# Patient Record
Sex: Female | Born: 1940 | Race: White | Hispanic: No | Marital: Married | Smoking: Former smoker
Health system: Southern US, Community
[De-identification: ages and names within clinical notes are randomized; demographics above are authoritative.]

## PROBLEM LIST (undated history)

## (undated) DIAGNOSIS — F32A Depression, unspecified: Secondary | ICD-10-CM

## (undated) DIAGNOSIS — M1990 Unspecified osteoarthritis, unspecified site: Secondary | ICD-10-CM

## (undated) DIAGNOSIS — K449 Diaphragmatic hernia without obstruction or gangrene: Secondary | ICD-10-CM

## (undated) DIAGNOSIS — M810 Age-related osteoporosis without current pathological fracture: Secondary | ICD-10-CM

## (undated) DIAGNOSIS — K219 Gastro-esophageal reflux disease without esophagitis: Secondary | ICD-10-CM

## (undated) DIAGNOSIS — D126 Benign neoplasm of colon, unspecified: Secondary | ICD-10-CM

## (undated) DIAGNOSIS — F419 Anxiety disorder, unspecified: Secondary | ICD-10-CM

## (undated) DIAGNOSIS — U071 COVID-19: Secondary | ICD-10-CM

## (undated) DIAGNOSIS — F329 Major depressive disorder, single episode, unspecified: Secondary | ICD-10-CM

## (undated) DIAGNOSIS — F320 Major depressive disorder, single episode, mild: Secondary | ICD-10-CM

## (undated) DIAGNOSIS — E7800 Pure hypercholesterolemia, unspecified: Secondary | ICD-10-CM

## (undated) DIAGNOSIS — M8580 Other specified disorders of bone density and structure, unspecified site: Secondary | ICD-10-CM

## (undated) DIAGNOSIS — I10 Essential (primary) hypertension: Secondary | ICD-10-CM

## (undated) DIAGNOSIS — N6019 Diffuse cystic mastopathy of unspecified breast: Secondary | ICD-10-CM

## (undated) HISTORY — DX: Anxiety disorder, unspecified: F41.9

## (undated) HISTORY — PX: CARPAL TUNNEL RELEASE: SHX101

## (undated) HISTORY — DX: Unspecified osteoarthritis, unspecified site: M19.90

## (undated) HISTORY — PX: OTHER SURGICAL HISTORY: SHX169

## (undated) HISTORY — DX: Benign neoplasm of colon, unspecified: D12.6

## (undated) HISTORY — DX: Pure hypercholesterolemia, unspecified: E78.00

## (undated) HISTORY — DX: Gastro-esophageal reflux disease without esophagitis: K21.9

## (undated) HISTORY — DX: Essential (primary) hypertension: I10

## (undated) HISTORY — DX: Major depressive disorder, single episode, mild: F32.0

## (undated) HISTORY — PX: TONSILLECTOMY: SUR1361

## (undated) HISTORY — PX: ABDOMINAL HYSTERECTOMY: SHX81

## (undated) HISTORY — DX: Depression, unspecified: F32.A

## (undated) HISTORY — DX: Major depressive disorder, single episode, unspecified: F32.9

## (undated) HISTORY — DX: Other specified disorders of bone density and structure, unspecified site: M85.80

## (undated) HISTORY — DX: Diffuse cystic mastopathy of unspecified breast: N60.19

## (undated) HISTORY — DX: Diaphragmatic hernia without obstruction or gangrene: K44.9

## (undated) HISTORY — DX: Age-related osteoporosis without current pathological fracture: M81.0

---

## 1971-08-12 DIAGNOSIS — N814 Uterovaginal prolapse, unspecified: Secondary | ICD-10-CM

## 1971-08-12 HISTORY — DX: Uterovaginal prolapse, unspecified: N81.4

## 1996-03-15 ENCOUNTER — Encounter: Payer: Self-pay | Admitting: Gastroenterology

## 1999-09-25 ENCOUNTER — Encounter: Admission: RE | Admit: 1999-09-25 | Discharge: 1999-09-25 | Payer: Self-pay | Admitting: Internal Medicine

## 1999-09-25 ENCOUNTER — Encounter: Payer: Self-pay | Admitting: Internal Medicine

## 1999-11-14 ENCOUNTER — Other Ambulatory Visit: Admission: RE | Admit: 1999-11-14 | Discharge: 1999-11-14 | Payer: Self-pay | Admitting: Internal Medicine

## 2000-09-29 ENCOUNTER — Encounter: Payer: Self-pay | Admitting: Internal Medicine

## 2000-09-29 ENCOUNTER — Encounter: Admission: RE | Admit: 2000-09-29 | Discharge: 2000-09-29 | Payer: Self-pay | Admitting: Internal Medicine

## 2000-11-20 ENCOUNTER — Other Ambulatory Visit: Admission: RE | Admit: 2000-11-20 | Discharge: 2000-11-20 | Payer: Self-pay | Admitting: Internal Medicine

## 2000-11-24 ENCOUNTER — Encounter: Admission: RE | Admit: 2000-11-24 | Discharge: 2000-11-24 | Payer: Self-pay | Admitting: Internal Medicine

## 2000-11-24 ENCOUNTER — Encounter: Payer: Self-pay | Admitting: Internal Medicine

## 2001-03-15 ENCOUNTER — Encounter: Admission: RE | Admit: 2001-03-15 | Discharge: 2001-03-15 | Payer: Self-pay | Admitting: Neurosurgery

## 2001-03-15 ENCOUNTER — Encounter: Payer: Self-pay | Admitting: Internal Medicine

## 2001-10-14 ENCOUNTER — Encounter: Payer: Self-pay | Admitting: Internal Medicine

## 2001-10-14 ENCOUNTER — Encounter: Admission: RE | Admit: 2001-10-14 | Discharge: 2001-10-14 | Payer: Self-pay | Admitting: Internal Medicine

## 2001-12-02 ENCOUNTER — Other Ambulatory Visit: Admission: RE | Admit: 2001-12-02 | Discharge: 2001-12-02 | Payer: Self-pay | Admitting: Internal Medicine

## 2002-10-20 ENCOUNTER — Encounter: Admission: RE | Admit: 2002-10-20 | Discharge: 2002-10-20 | Payer: Self-pay | Admitting: Internal Medicine

## 2002-10-20 ENCOUNTER — Encounter: Payer: Self-pay | Admitting: Internal Medicine

## 2002-12-22 ENCOUNTER — Other Ambulatory Visit: Admission: RE | Admit: 2002-12-22 | Discharge: 2002-12-22 | Payer: Self-pay | Admitting: Internal Medicine

## 2003-08-31 ENCOUNTER — Encounter: Payer: Self-pay | Admitting: Gastroenterology

## 2003-11-29 ENCOUNTER — Encounter: Admission: RE | Admit: 2003-11-29 | Discharge: 2003-11-29 | Payer: Self-pay | Admitting: Internal Medicine

## 2004-01-04 ENCOUNTER — Emergency Department (HOSPITAL_COMMUNITY): Admission: EM | Admit: 2004-01-04 | Discharge: 2004-01-05 | Payer: Self-pay | Admitting: Emergency Medicine

## 2004-01-10 ENCOUNTER — Ambulatory Visit (HOSPITAL_COMMUNITY): Admission: RE | Admit: 2004-01-10 | Discharge: 2004-01-10 | Payer: Self-pay | Admitting: *Deleted

## 2004-01-10 ENCOUNTER — Ambulatory Visit (HOSPITAL_BASED_OUTPATIENT_CLINIC_OR_DEPARTMENT_OTHER): Admission: RE | Admit: 2004-01-10 | Discharge: 2004-01-10 | Payer: Self-pay | Admitting: *Deleted

## 2004-02-16 ENCOUNTER — Ambulatory Visit (HOSPITAL_BASED_OUTPATIENT_CLINIC_OR_DEPARTMENT_OTHER): Admission: RE | Admit: 2004-02-16 | Discharge: 2004-02-16 | Payer: Self-pay | Admitting: *Deleted

## 2004-12-13 ENCOUNTER — Ambulatory Visit: Payer: Self-pay | Admitting: Gastroenterology

## 2004-12-18 ENCOUNTER — Ambulatory Visit: Payer: Self-pay | Admitting: Gastroenterology

## 2005-01-10 ENCOUNTER — Encounter: Admission: RE | Admit: 2005-01-10 | Discharge: 2005-01-10 | Payer: Self-pay | Admitting: Internal Medicine

## 2005-01-27 ENCOUNTER — Encounter: Admission: RE | Admit: 2005-01-27 | Discharge: 2005-01-27 | Payer: Self-pay | Admitting: Internal Medicine

## 2006-03-25 ENCOUNTER — Encounter: Admission: RE | Admit: 2006-03-25 | Discharge: 2006-03-25 | Payer: Self-pay | Admitting: Internal Medicine

## 2006-04-23 ENCOUNTER — Ambulatory Visit: Payer: Self-pay | Admitting: Gastroenterology

## 2007-06-30 ENCOUNTER — Encounter: Admission: RE | Admit: 2007-06-30 | Discharge: 2007-06-30 | Payer: Self-pay | Admitting: Internal Medicine

## 2007-08-27 ENCOUNTER — Ambulatory Visit: Payer: Self-pay | Admitting: Gastroenterology

## 2008-08-25 ENCOUNTER — Encounter: Admission: RE | Admit: 2008-08-25 | Discharge: 2008-08-25 | Payer: Self-pay | Admitting: Internal Medicine

## 2008-08-25 ENCOUNTER — Encounter: Payer: Self-pay | Admitting: Gastroenterology

## 2008-11-07 ENCOUNTER — Encounter: Admission: RE | Admit: 2008-11-07 | Discharge: 2008-11-07 | Payer: Self-pay | Admitting: Internal Medicine

## 2009-05-11 DIAGNOSIS — D126 Benign neoplasm of colon, unspecified: Secondary | ICD-10-CM

## 2009-05-11 HISTORY — DX: Benign neoplasm of colon, unspecified: D12.6

## 2009-05-14 ENCOUNTER — Ambulatory Visit: Payer: Self-pay | Admitting: Gastroenterology

## 2009-05-14 DIAGNOSIS — K219 Gastro-esophageal reflux disease without esophagitis: Secondary | ICD-10-CM

## 2009-05-14 DIAGNOSIS — R1319 Other dysphagia: Secondary | ICD-10-CM

## 2009-05-16 ENCOUNTER — Ambulatory Visit: Payer: Self-pay | Admitting: Gastroenterology

## 2009-05-16 ENCOUNTER — Encounter: Payer: Self-pay | Admitting: Gastroenterology

## 2009-05-18 ENCOUNTER — Encounter: Payer: Self-pay | Admitting: Gastroenterology

## 2009-11-07 ENCOUNTER — Encounter: Admission: RE | Admit: 2009-11-07 | Discharge: 2009-11-07 | Payer: Self-pay | Admitting: Internal Medicine

## 2009-11-07 ENCOUNTER — Encounter: Payer: Self-pay | Admitting: Gastroenterology

## 2010-03-05 ENCOUNTER — Encounter: Admission: RE | Admit: 2010-03-05 | Discharge: 2010-03-05 | Payer: Self-pay | Admitting: Internal Medicine

## 2010-05-15 ENCOUNTER — Other Ambulatory Visit: Admission: RE | Admit: 2010-05-15 | Discharge: 2010-05-15 | Payer: Self-pay | Admitting: Internal Medicine

## 2010-09-01 ENCOUNTER — Encounter: Payer: Self-pay | Admitting: Internal Medicine

## 2010-10-28 ENCOUNTER — Ambulatory Visit: Payer: Self-pay | Admitting: Gastroenterology

## 2010-12-24 NOTE — Assessment & Plan Note (Signed)
[facility]                         GASTROENTEROLOGY OFFICE NOTE   NAME:Candace Stewart, Candace Stewart                      MRN:          [id number]  DATE:08/27/2007                            DOB:          September 20, 1940    This is a return office visit for GERD with LPR symptoms.  She relates  return in substernal chest pain with nausea.  The pain occasionally  radiates to the back.  It is not related to exertion. She has noted  occasional hoarseness and an occasional cough for about the past two  months.  She has been taking Nexium on a daily basis.  Her symptoms came  under better control in 2007 while on Nexium twice a day.  She has no  dysphagia, odynophagia or weight loss.   CURRENT MEDICATIONS:  Listed on the chart - updated and reviewed.   MEDICATION ALLERGIES:  SU FA DRUGS, AMOXICILLIN.   PHYSICAL EXAMINATION:  No acute distress.  Weight:  137 pounds.  Blood  pressure:  124/72.  Pulse:  68, regular.  HEENT:  Anicteric sclerae.  Oropharynx clear.  CHEST:  Clear to auscultation bilaterally.  CARDIAC:  Regular rate and rhythm without murmurs appreciated.  ABDOMEN:  Soft and nontender within normoactive bowel sounds.  No  palpable organomegaly, masses or hernia.   ASSESSMENT/PLAN:  Gastroesophageal reflux disease with LPR.  Recent  flare of symptoms.  Reintensify all antireflux measures and begin the  use of 4 inch bed blocks.  Change Nexium to 40 mg p.o. b.i.d. taken 30  minutes before breakfast and dinner.  If her symptoms come under good  control, she should remain on this regimen for at least three months and  then she may return to daily Nexium.  She may need to stay on Nexium  twice a day long term for adequate symptom control.  Return office visit  six moths.     Venita Lick. Russella Dar, MD, [facility]  Electronically Signed    MTS/MedQ  DD: 08/31/2007  DT: 08/31/2007  Job #: [id number]

## 2010-12-27 NOTE — Op Note (Signed)
NAME:  Candace Stewart, Candace Stewart                         ACCOUNT NO.:  [id number]   MEDICAL RECORD NO.:  [id number]                   PATIENT TYPE:  AMB   LOCATION:  DSC                                  FACILITY:  [facility]   PHYSICIAN:  Lowell Bouton, M.D.      DATE OF BIRTH:  03-19-1941   DATE OF PROCEDURE:  01/10/2004  DATE OF DISCHARGE:                                 OPERATIVE REPORT   PREOPERATIVE DIAGNOSIS:  Intra-articular fracture, right distal radius.   POSTOPERATIVE DIAGNOSIS:  Intra-articular fracture, right distal radius.   PROCEDURE:  Open reduction and internal fixation right distal radius.   SURGEON:  Lowell Bouton, M.D.   ANESTHESIA:  Axillary block.   OPERATIVE FINDINGS:  The patient had a comminuted fracture of the distal  radius and ulna that was intra-articular.  It was very distal and she had a  fair amount of osteopenia.   PROCEDURE:  Under axillary block anesthesia with a tourniquet on the right  arm, the right hand was prepped and draped in the usual fashion.  After  exsanguinating the limb, the tourniquet was inflated to 250 mmHg.  The index  and middle fingers were placed in finger trap traction and longitudinal 10  pound traction was applied across the end of the hand table.  A longitudinal  incision was made overlying the FCR tendon volarly.  Sharp dissection was  carried through the subcutaneous tissues and bleeding points were  coagulated.  Blunt dissection was carried through the FCR fascia and the  tendon was retracted ulnarly.  The floor of the FCR sheath was incised with  the scissors.  Blunt dissection was carried down to the pronator quadratus  and the FPL tendon was retracted ulnarly.  A small Weitlaner was inserted  for retraction and the pronator quadratus was divided longitudinally from  just adjacent to the attachment to the radius.  A Glorious Peach was used to elevate  the pronator quadratus off the bone.  The fracture was then reduced  using a  Therapist, nutritional and x-rays showed good alignment.  An Acumed short plate was  applied to the volar aspect of the radius.  The sliding screw was inserted  using a nonlocking 3.2 mm screw.  The guide was then placed distally and  three pegs were inserted using one nonlocking and two locking.  X-rays  showed good position of the pegs and the screws.  Two more proximal locking  screws were then inserted in the plate using the 3.2 mm screws.  Three more  distal screws were then inserted distal to the first PEG.  X-rays showed  excellent alignment of the fracture.  There was no evidence of penetration  of the articular surface.  The wound was then copiously irrigated with  saline.  The guide was removed.  The pronator quadratus was repaired with 4-  0 Vicryl.  A vessel loop drain was left in for drainage.  The subcutaneous  tissue  was closed with 4-0 Vicryl.  The skin was closed with a 4-0  subcuticular Prolene.  Steri-Strips were applied followed by sterile  dressing and a volar wrist splint.  The tourniquet was released with good  circulation of the hand.  The patient went to the recovery room awake,  stable, in good condition.                                               Lowell Bouton, M.D.    EMM/MEDQ  D:  01/10/2004  T:  01/10/2004  Job:  [id number]

## 2010-12-27 NOTE — Op Note (Signed)
NAME:  Candace Stewart, Candace Stewart                         ACCOUNT NO.:  [id number]   MEDICAL RECORD NO.:  [id number]                   PATIENT TYPE:  AMB   LOCATION:  DSC                                  FACILITY:  [facility]   PHYSICIAN:  Lowell Bouton, M.D.      DATE OF BIRTH:  1940/12/24   DATE OF PROCEDURE:  02/16/2004  DATE OF DISCHARGE:                                 OPERATIVE REPORT   PREOPERATIVE DIAGNOSIS:  Right carpal tunnel syndrome.   POSTOPERATIVE DIAGNOSIS:  Right carpal tunnel syndrome.   OPERATION PERFORMED:  Decompression of median nerve, right carpal tunnel.   SURGEON:  Lowell Bouton, M.D.   ANESTHESIA:  0.5% Marcaine local with sedation.   OPERATIVE FINDINGS:  The patient had a previous distal radius fracture, but  has had carpal tunnel syndrome years ago.  She was told years ago that she  needed surgery but her symptoms resolved.  Just after her wrist fracture she  was not having any symptoms until she started in therapy.  At that point,  she developed numbness and tingling that was very severe.  At the time of  surgery, she had significant pressure on her median nerve.  There was an  intact motor branch.   DESCRIPTION OF PROCEDURE:  Under 0.5% Marcaine local anesthesia with a  tourniquet on the right arm, the right hand was prepped and draped in the  usual fashion and after exsanguinating the limb, the tourniquet was inflated  to 225 mmHg.  A 3 cm longitudinal incision was made in the palm just ulnar  to the thenar crease and then extended in a zigzag fashion across the wrist  crease.  Sharp dissection was carried through the subcutaneous tissues.  Blunt dissection was carried down to the superficial palmar fascia distal to  the transverse carpal ligament.  A hemostat was placed in the carpal canal  up against the hook of the hamate and the transverse carpal ligament was  divided on the ulnar border of the medial nerve.  The distal forearm fascia  was incised at the wrist level.  After completely releasing the carpal  canal, the nerve was examined and the motor branch was found to be intact.  The wound was then irrigated with saline.  The skin was closed with 4-0  nylon suture.  Sterile dressings were applied followed by a volar wrist  splint.  The patient tolerated the procedure well and went to the recovery  room awake and stable in  good condition.                                               Lowell Bouton, M.D.    EMM/MEDQ  D:  02/16/2004  T:  02/17/2004  Job:  [id number]   cc:   Janae Bridgeman. Eloise Harman., M.D.  [address] [place]  [place] [address]  Fax: [phone]

## 2010-12-27 NOTE — Assessment & Plan Note (Signed)
[facility]                           GASTROENTEROLOGY OFFICE NOTE   Candace Stewart, Candace Stewart                      MRN:          [id number]  DATE:04/23/2006                            DOB:          02/08/1941    Candace Stewart notes worsening reflux symptoms with frequent mild hoarseness and  frequent throat clearing.  These symptoms have been present for several  months.  Occasionally, she notes some mild solid food dysphagia but this is  sometimes associated with regurgitation.  She takes her Nexium at bedtime  and, generally, has a meal at bedtime, as she works until midnight.  She is  using a bed wedge but is not using bed blocks.   CURRENT MEDICATIONS:  Listed on the chart, updated, and reviewed.   MEDICATION ALLERGIES:  SULFA DRUGS.   EXAM:  GENERAL:  No acute distress.  VITAL SIGNS:  Weight 138.6 pounds.  Blood pressure is 122/66.  Pulse is 90  and regular.  HEENT:  Anicteric sclerae.  Oropharynx clear.  CHEST:  Clear to auscultation bilaterally.  CARDIAC:  Regular rate and rhythm without murmurs appreciated.  ABDOMEN:  Soft and nontender with normoactive bowel sounds.   ASSESSMENT AND PLAN:  Worsening gastroesophageal reflux disease with LPR  symptoms.  We will plan to re-intensify all antireflux measures.  Written  instructions were supplied to the patient.  She is to avoid meals within 3  to 4 hours of bedtime.  She is advised to begin the use of 4-inch bed  blocks, increase Nexium to 40 mg p.o. b.i.d. taken 30 to 60 minutes prior to  a meal.  Return office visit in 3 to 4 months.                                   Venita Lick. Russella Dar, MD, [facility]   MTS/MedQ  DD:  04/23/2006  DT:  04/23/2006  Job #:  [id number]

## 2011-04-16 ENCOUNTER — Other Ambulatory Visit: Payer: Self-pay | Admitting: Internal Medicine

## 2011-04-16 DIAGNOSIS — Z1231 Encounter for screening mammogram for malignant neoplasm of breast: Secondary | ICD-10-CM

## 2011-04-17 ENCOUNTER — Encounter: Payer: Self-pay | Admitting: Gastroenterology

## 2011-04-17 ENCOUNTER — Other Ambulatory Visit (INDEPENDENT_AMBULATORY_CARE_PROVIDER_SITE_OTHER): Payer: Medicare Other

## 2011-04-17 ENCOUNTER — Ambulatory Visit (INDEPENDENT_AMBULATORY_CARE_PROVIDER_SITE_OTHER): Payer: Medicare Other | Admitting: Gastroenterology

## 2011-04-17 VITALS — BP 118/62 | HR 72 | Ht <= 58 in | Wt 134.4 lb

## 2011-04-17 DIAGNOSIS — Z86010 Personal history of colonic polyps: Secondary | ICD-10-CM

## 2011-04-17 DIAGNOSIS — K219 Gastro-esophageal reflux disease without esophagitis: Secondary | ICD-10-CM

## 2011-04-17 DIAGNOSIS — R1011 Right upper quadrant pain: Secondary | ICD-10-CM

## 2011-04-17 LAB — HEPATIC FUNCTION PANEL
ALT: 11 U/L (ref 0–35)
AST: 13 U/L (ref 0–37)
Alkaline Phosphatase: 83 U/L (ref 39–117)
Bilirubin, Direct: 0.1 mg/dL (ref 0.0–0.3)
Total Bilirubin: 0.4 mg/dL (ref 0.3–1.2)

## 2011-04-17 NOTE — Progress Notes (Signed)
History of Present Illness: This is a 70 year old female in who has had intermittent problems with right upper quadrant and right lower chest pain. These symptoms are brought on by movement and physical activity and follow meals. The symptoms occur intermittently and are often associated with gas and bloating when her symptoms occur with meals. She was evaluated by Dr. Selena Batten last year for similar complaints and underwent an abdominal ultrasound in March 2011 showing no significant abnormalities except for mild dilation of the distal common bile. Patient has chronic GERD with LPR which is well-controlled on Nexium twice daily. She underwent upper endoscopy and colonoscopy in October 2010. Denies weight loss, constipation, diarrhea, change in stool caliber, melena, hematochezia, nausea, vomiting, dysphagia, reflux symptoms.  Current Medications, Allergies, Past Medical History, Past Surgical History, Family History and Social History were reviewed in [employer] record.  Physical Exam: General: Well developed , well nourished, no acute distress Head: Normocephalic and atraumatic Eyes:  sclerae anicteric, EOMI Ears: Normal auditory acuity Mouth: No deformity or lesions Lungs: Clear throughout to auscultation Heart: Regular rate and rhythm; no murmurs, rubs or bruits Abdomen: Soft, mild tenderness in right upper quadrant right costal margin and right lower chest wall without rebound or guarding and non distended. No masses, hepatosplenomegaly or hernias noted. Normal Bowel sounds Musculoskeletal: Symmetrical with no gross deformities  Pulses:  Normal pulses noted Extremities: No clubbing, cyanosis, edema or deformities noted Neurological: Alert oriented x 4, grossly nonfocal Psychological:  Alert and cooperative. Normal mood and affect  Assessment and Recommendations:  1. RUQ abdominal pain and right chest pain. Mild distal common bile duct dilatation is likely nonspecific but  needs further evaluation. R/O biliary symptoms, musculoskeletal symptoms and gastroparesis. Check LFTs. Schedule abd [place]. If she still has a persistent abnormality in her biliary tree consider MRCP or ERCP. If [place] negative proceed with CCK-HIDA and if negative a GES.  2. GERD. Continue Nexium 40 mg bid.  3. Personal history of adenomatous colon polyps. Colonoscopy in 05/2014.

## 2011-04-17 NOTE — Patient Instructions (Addendum)
Go directly to the basement to have your labs drawn today. You have been scheduled for a abdominal ultrasound at [facility] on 04/21/11 at 8:00am. Nothing to eat or drink after midnight. Please arrive 15 minutes early for registration. cc: Pearson Grippe, MD

## 2011-04-18 ENCOUNTER — Encounter: Payer: Self-pay | Admitting: Gastroenterology

## 2011-04-21 ENCOUNTER — Ambulatory Visit (HOSPITAL_COMMUNITY)
Admission: RE | Admit: 2011-04-21 | Discharge: 2011-04-21 | Disposition: A | Discharge status: Home / Self Care | Payer: Medicare Other | Source: Ambulatory Visit | Attending: Gastroenterology | Admitting: Gastroenterology

## 2011-04-21 ENCOUNTER — Telehealth: Payer: Self-pay

## 2011-04-21 DIAGNOSIS — N1330 Unspecified hydronephrosis: Secondary | ICD-10-CM | POA: Insufficient documentation

## 2011-04-21 DIAGNOSIS — R1011 Right upper quadrant pain: Secondary | ICD-10-CM

## 2011-04-21 DIAGNOSIS — K838 Other specified diseases of biliary tract: Secondary | ICD-10-CM

## 2011-04-21 NOTE — Telephone Encounter (Signed)
Patient advised of MRI scheduled for 04/28/11 8:30 arrival.  She will have her BUN and creatinine drawn at 8:30 and MRI at 9:15.  She is also advised to be NPO after midnight

## 2011-04-21 NOTE — Telephone Encounter (Signed)
Message copied by Annett Fabian on Mon Apr 21, 2011  9:47 AM ------      Message from: Claudette Head T      Created: Mon Apr 21, 2011  8:37 AM       Schedule MRCP for dilated CBD on [place].

## 2011-04-28 ENCOUNTER — Ambulatory Visit
Admission: RE | Admit: 2011-04-28 | Discharge: 2011-04-28 | Disposition: A | Discharge status: Home / Self Care | Payer: Medicare Other | Source: Ambulatory Visit | Attending: Gastroenterology | Admitting: Gastroenterology

## 2011-04-28 DIAGNOSIS — K838 Other specified diseases of biliary tract: Secondary | ICD-10-CM

## 2011-04-28 MED ORDER — GADOBENATE DIMEGLUMINE 529 MG/ML IV SOLN
12.0000 mL | Freq: Once | INTRAVENOUS | Status: AC | PRN
  Administered 2011-04-28: 12 mL via INTRAVENOUS

## 2011-05-02 ENCOUNTER — Ambulatory Visit
Admission: RE | Admit: 2011-05-02 | Discharge: 2011-05-02 | Disposition: A | Discharge status: Home / Self Care | Payer: Medicare Other | Source: Ambulatory Visit | Attending: Internal Medicine | Admitting: Internal Medicine

## 2011-05-02 DIAGNOSIS — Z1231 Encounter for screening mammogram for malignant neoplasm of breast: Secondary | ICD-10-CM

## 2011-07-22 ENCOUNTER — Other Ambulatory Visit: Payer: Self-pay

## 2011-07-22 DIAGNOSIS — N1330 Unspecified hydronephrosis: Secondary | ICD-10-CM

## 2012-05-18 ENCOUNTER — Other Ambulatory Visit: Payer: Self-pay | Admitting: Internal Medicine

## 2012-05-18 DIAGNOSIS — Z1231 Encounter for screening mammogram for malignant neoplasm of breast: Secondary | ICD-10-CM

## 2012-06-02 ENCOUNTER — Ambulatory Visit
Admission: RE | Admit: 2012-06-02 | Discharge: 2012-06-02 | Disposition: A | Discharge status: Home / Self Care | Payer: Medicare Other | Source: Ambulatory Visit | Attending: Internal Medicine | Admitting: Internal Medicine

## 2012-06-02 DIAGNOSIS — Z1231 Encounter for screening mammogram for malignant neoplasm of breast: Secondary | ICD-10-CM

## 2013-01-16 ENCOUNTER — Encounter (HOSPITAL_COMMUNITY): Payer: Self-pay | Admitting: *Deleted

## 2013-01-16 ENCOUNTER — Observation Stay (HOSPITAL_COMMUNITY)
Admission: EM | Admit: 2013-01-16 | Discharge: 2013-01-18 | Disposition: A | Discharge status: Home / Self Care | Payer: Medicare Other | Attending: Cardiology | Admitting: Cardiology

## 2013-01-16 ENCOUNTER — Emergency Department (HOSPITAL_COMMUNITY): Payer: Medicare Other

## 2013-01-16 DIAGNOSIS — Z79899 Other long term (current) drug therapy: Secondary | ICD-10-CM | POA: Insufficient documentation

## 2013-01-16 DIAGNOSIS — Q245 Malformation of coronary vessels: Secondary | ICD-10-CM | POA: Insufficient documentation

## 2013-01-16 DIAGNOSIS — R079 Chest pain, unspecified: Secondary | ICD-10-CM

## 2013-01-16 DIAGNOSIS — K449 Diaphragmatic hernia without obstruction or gangrene: Secondary | ICD-10-CM | POA: Insufficient documentation

## 2013-01-16 DIAGNOSIS — H539 Unspecified visual disturbance: Secondary | ICD-10-CM | POA: Insufficient documentation

## 2013-01-16 DIAGNOSIS — R0602 Shortness of breath: Secondary | ICD-10-CM | POA: Insufficient documentation

## 2013-01-16 DIAGNOSIS — K219 Gastro-esophageal reflux disease without esophagitis: Secondary | ICD-10-CM | POA: Insufficient documentation

## 2013-01-16 DIAGNOSIS — K224 Dyskinesia of esophagus: Principal | ICD-10-CM | POA: Insufficient documentation

## 2013-01-16 LAB — PRO B NATRIURETIC PEPTIDE: Pro B Natriuretic peptide (BNP): 21.8 pg/mL (ref 0–125)

## 2013-01-16 LAB — BASIC METABOLIC PANEL
CO2: 30 mEq/L (ref 19–32)
Calcium: 9.4 mg/dL (ref 8.4–10.5)
Creatinine, Ser: 0.74 mg/dL (ref 0.50–1.10)
GFR calc Af Amer: >90 mL/min (ref >90)

## 2013-01-16 LAB — CBC
MCH: 30.2 pg (ref 26.0–34.0)
MCV: 90.2 fL (ref 78.0–100.0)
Platelets: 283 10*3/uL (ref 150–400)
RDW: 13.2 % (ref 11.5–15.5)

## 2013-01-16 MED ORDER — ASPIRIN 81 MG PO CHEW: 324.0000 mg | CHEWABLE_TABLET | ORAL | Status: AC

## 2013-01-16 MED ORDER — ASPIRIN 300 MG RE SUPP
300.0000 mg | RECTAL | Status: AC
  Filled 2013-01-16: qty 1

## 2013-01-16 MED ORDER — ASPIRIN EC 81 MG PO TBEC
81.0000 mg | DELAYED_RELEASE_TABLET | Freq: Every day | ORAL | Status: DC
  Filled 2013-01-16 (×2): qty 1

## 2013-01-16 MED ORDER — ASPIRIN 81 MG PO CHEW
324.0000 mg | CHEWABLE_TABLET | Freq: Every day | ORAL | Status: DC
  Administered 2013-01-17: 324 mg via ORAL
  Filled 2013-01-16 (×2): qty 4

## 2013-01-16 MED ORDER — CARVEDILOL 3.125 MG PO TABS
3.1250 mg | ORAL_TABLET | Freq: Two times a day (BID) | ORAL | Status: DC
  Administered 2013-01-17 – 2013-01-18 (×2): 3.125 mg via ORAL
  Filled 2013-01-16 (×6): qty 1

## 2013-01-16 MED ORDER — NITROGLYCERIN 0.4 MG SL SUBL: 0.4000 mg | SUBLINGUAL_TABLET | SUBLINGUAL | Status: DC | PRN

## 2013-01-16 MED ORDER — ONDANSETRON HCL 4 MG/2ML IJ SOLN: 4.0000 mg | Freq: Four times a day (QID) | INTRAMUSCULAR | Status: DC | PRN

## 2013-01-16 MED ORDER — ACETAMINOPHEN 325 MG PO TABS
650.0000 mg | ORAL_TABLET | ORAL | Status: DC | PRN
  Administered 2013-01-17: 20:00:00 650 mg via ORAL
  Filled 2013-01-16: qty 2

## 2013-01-16 MED ORDER — ATORVASTATIN CALCIUM 20 MG PO TABS
20.0000 mg | ORAL_TABLET | Freq: Every day | ORAL | Status: DC
  Filled 2013-01-16: qty 1

## 2013-01-16 NOTE — ED Notes (Signed)
Reports mid chest pains that started this am, radiates into her neck and face. Mild sob with exertion. ekg done at triage.

## 2013-01-16 NOTE — ED Notes (Signed)
Report called to [place], RN on 3 west.

## 2013-01-16 NOTE — ED Provider Notes (Signed)
History    CSN: [id number] Arrival date & time 01/16/13  1738 First MD Initiated Contact with Patient 01/16/13 1833      Chief Complaint  Patient presents with  . Chest Pain  . Shortness of Breath    Patient is a 72 y.o. female presenting with chest pain and shortness of breath. The history is provided by the patient.  Chest Pain Pain location:  L chest Pain quality: dull   Pain radiates to:  L jaw Pain severity:  Moderate Onset quality:  Unable to specify Duration: it lasted most of the day. Timing:  Constant Chronicity:  New Worsened by:  Exertion Associated symptoms: shortness of breath   Associated symptoms: no cough, no fever, no nausea and not vomiting   Shortness of Breath Associated symptoms: chest pain   Associated symptoms: no cough, no fever and no vomiting   Pt did have symptoms like this back in October.  She saw Dr Jacinto Halim and had a stress test.  She does notice some symptoms ever since when she exerts herself.  Today it has been getting worse.  Past Medical History  Diagnosis Date  . GERD (gastroesophageal reflux disease)     w/ LPR  . Hiatal hernia   . Depression   . Allergic rhinitis   . Hemorrhoids   . Osteopenia   . Tubular adenoma of colon 05/2009    Past Surgical History  Procedure Laterality Date  . Tonsillectomy    . Abdominal hysterectomy    . Carpal tunnel release    . Right arm surgery      Family History  Problem Relation Age of Onset  . Colon polyps Sister   . Colon cancer Cousin 60  . Colitis      Mother's side of family  . Diabetes Father   . Diabetes Son   . Diabetes Sister   . Diabetes Brother   . Kidney disease Sister     History  Substance Use Topics  . Smoking status: Former Smoker    Types: Cigarettes  . Smokeless tobacco: Never Used  . Alcohol Use: No    OB History   Grav Para Term Preterm Abortions TAB SAB Ect Mult Living                  Review of Systems  Constitutional: Negative for fever.   Respiratory: Positive for shortness of breath. Negative for cough.   Cardiovascular: Positive for chest pain.  Gastrointestinal: Negative for nausea and vomiting.  All other systems reviewed and are negative.    Allergies  Amoxicillin and Sulfonamide derivatives  Home Medications   Current Outpatient Rx  Name  Route  Sig  Dispense  Refill  . calcium gluconate 500 MG tablet   Oral   Take 500 mg by mouth 2 (two) times daily.           Marland Kitchen esomeprazole (NEXIUM) 40 MG capsule   Oral   Take 40 mg by mouth daily before breakfast.          . LORazepam (ATIVAN) 0.5 MG tablet   Oral   Take 0.5 mg by mouth every 8 (eight) hours as needed for anxiety.          . Multiple Vitamin (MULTIVITAMIN WITH MINERALS) TABS   Oral   Take 1 tablet by mouth daily.           BP 150/63  Pulse 82  Resp 20  SpO2 99%  Physical Exam  Nursing note and vitals reviewed. Constitutional: She appears well-developed and well-nourished. No distress.  HENT:  Head: Normocephalic and atraumatic.  Right Ear: External ear normal.  Left Ear: External ear normal.  Eyes: Conjunctivae are normal. Right eye exhibits no discharge. Left eye exhibits no discharge. No scleral icterus.  Neck: Neck supple. No tracheal deviation present.  Cardiovascular: Normal rate, regular rhythm and intact distal pulses.   Pulmonary/Chest: Effort normal and breath sounds normal. No stridor. No respiratory distress. She has no wheezes. She has no rales.  Abdominal: Soft. Bowel sounds are normal. She exhibits no distension. There is no tenderness. There is no rebound and no guarding.  Musculoskeletal: She exhibits no edema and no tenderness.  Neurological: She is alert. She has normal strength. No sensory deficit. Cranial nerve deficit:  no gross defecits noted. She exhibits normal muscle tone. She displays no seizure activity. Coordination normal.  Skin: Skin is warm and dry. No rash noted.  Psychiatric: She has a normal mood  and affect.    ED Course  Procedures (including critical care time)  Rate: 83  Rhythm: normal sinus rhythm  QRS Axis: normal  Intervals: normal  ST/T Wave abnormalities: normal  Conduction Disutrbances:none  Narrative Interpretation:   Old EKG Reviewed: none available  Labs Reviewed  BASIC METABOLIC PANEL - Abnormal; Notable for the following:    Glucose, Bld 106 (*)    GFR calc non Af Amer 83 (*)    All other components within normal limits  CBC  PRO B NATRIURETIC PEPTIDE  POCT I-STAT TROPONIN I   Dg Chest 2 View  01/16/2013   *RADIOLOGY REPORT*  Clinical Data: Chest pain.  Short of breath.  CHEST - 2 VIEW  Comparison: CT abdomen 10/12/2008.  Findings: Hazy opacity is present along the cardiac apex compatible with prominent left-sided pericardial fat pad seen on prior CT. Cardiopericardial silhouette appears within normal limits. Mediastinal contours normal.  No airspace disease.  No effusion. Monitoring leads are projected over the chest.  Thoracic spondylosis is present.  IMPRESSION: No acute cardiopulmonary disease.   Original Report Authenticated By: Andreas Newport, M.D.    1. Chest pain      MDM  The patient had a stress test previously as an outpatient. Her symptoms however have persisted and have increased recently.  I am concerned about her anginal type symptoms on exertion. Initial laboratory EKG tests are reassuring. I will consult with cardiology regarding possible admission for further evaluation to see if the Patient may ultimately require cardiac catheterization.       Celene Kras, MD 01/16/13 1946

## 2013-01-16 NOTE — Progress Notes (Signed)
ANTICOAGULATION CONSULT NOTE - Initial Consult  Pharmacy Consult for heparin  Indication: chest pain/ACS  Allergies  Allergen Reactions  . Amoxicillin Diarrhea  . Sulfonamide Derivatives     unknown    Patient Measurements: Height: 4\' 10"  (147.3 cm) Weight: 137 lb 6.4 oz (62.324 kg) IBW/kg (Calculated) : 40.9 Heparin Dosing Weight:   Vital Signs: Temp: 98.4 F (36.9 C) (06/08 2139) Temp src: Oral (06/08 2139) BP: 150/84 mmHg (06/08 2139) Pulse Rate: 78 (06/08 2139)  Labs:  Recent Labs  01/16/13 1751  HGB 13.0  HCT 38.8  PLT 283  CREATININE 0.74    Estimated Creatinine Clearance: 49.7 ml/min (by C-G formula based on Cr of 0.74).   Medical History: Past Medical History  Diagnosis Date  . GERD (gastroesophageal reflux disease)     w/ LPR  . Hiatal hernia   . Depression   . Allergic rhinitis   . Hemorrhoids   . Osteopenia   . Tubular adenoma of colon 05/2009    Medications:  Prescriptions prior to admission  Medication Sig Dispense Refill  . calcium gluconate 500 MG tablet Take 500 mg by mouth 2 (two) times daily.        Marland Kitchen esomeprazole (NEXIUM) 40 MG capsule Take 40 mg by mouth daily before breakfast.       . LORazepam (ATIVAN) 0.5 MG tablet Take 0.5 mg by mouth every 8 (eight) hours as needed for anxiety.       . Multiple Vitamin (MULTIVITAMIN WITH MINERALS) TABS Take 1 tablet by mouth daily.        Assessment: 72 yo female complaining of CP and sob-hemodynamically stable  Goal of Therapy:  Heparin level 0.3-0.7 units/ml Monitor platelets by anticoagulation protocol: Yes   Plan:  Heparin 2500 units bolus then 850 units/hr. Will check HL in 8 hours. Then daily HL and cbc starting 6/10.    Janice Coffin 01/16/2013,11:48 PM

## 2013-01-17 ENCOUNTER — Encounter (HOSPITAL_COMMUNITY)
Admission: EM | Disposition: A | Discharge status: Home / Self Care | Payer: Self-pay | Source: Home / Self Care | Attending: Emergency Medicine

## 2013-01-17 HISTORY — PX: LEFT HEART CATHETERIZATION WITH CORONARY ANGIOGRAM: SHX5451

## 2013-01-17 LAB — TROPONIN I
Troponin I: <0.3 ng/mL (ref <0.30)
Troponin I: <0.3 ng/mL (ref <0.30)
Troponin I: <0.3 ng/mL (ref <0.30)

## 2013-01-17 SURGERY — LEFT HEART CATHETERIZATION WITH CORONARY ANGIOGRAM
Anesthesia: LOCAL

## 2013-01-17 MED ORDER — SODIUM CHLORIDE 0.9 % IV SOLN
INTRAVENOUS | Status: DC
  Administered 2013-01-17 (×2): via INTRAVENOUS

## 2013-01-17 MED ORDER — HEPARIN (PORCINE) IN NACL 2-0.9 UNIT/ML-% IJ SOLN
INTRAMUSCULAR | Status: AC
  Filled 2013-01-17: qty 1000

## 2013-01-17 MED ORDER — SODIUM CHLORIDE 0.9 % IJ SOLN: 3.0000 mL | Freq: Two times a day (BID) | INTRAMUSCULAR | Status: DC

## 2013-01-17 MED ORDER — HEPARIN SODIUM (PORCINE) 1000 UNIT/ML IJ SOLN
INTRAMUSCULAR | Status: AC
  Filled 2013-01-17: qty 1

## 2013-01-17 MED ORDER — FENTANYL CITRATE 0.05 MG/ML IJ SOLN
INTRAMUSCULAR | Status: AC
  Filled 2013-01-17: qty 2

## 2013-01-17 MED ORDER — VERAPAMIL HCL 2.5 MG/ML IV SOLN
INTRAVENOUS | Status: AC
  Filled 2013-01-17: qty 2

## 2013-01-17 MED ORDER — SODIUM CHLORIDE 0.9 % IJ SOLN: 3.0000 mL | INTRAMUSCULAR | Status: DC | PRN

## 2013-01-17 MED ORDER — DILTIAZEM HCL ER COATED BEADS 180 MG PO CP24: 180.0000 mg | ORAL_CAPSULE | Freq: Every day | ORAL | Status: DC

## 2013-01-17 MED ORDER — SODIUM CHLORIDE 0.9 % IV SOLN
1.0000 mL/kg/h | INTRAVENOUS | Status: DC
  Administered 2013-01-17: 1 mL/kg/h via INTRAVENOUS

## 2013-01-17 MED ORDER — HEPARIN BOLUS VIA INFUSION
2500.0000 [IU] | Freq: Once | INTRAVENOUS | Status: AC
  Administered 2013-01-17: 2500 [IU] via INTRAVENOUS
  Filled 2013-01-17: qty 2500

## 2013-01-17 MED ORDER — ADULT MULTIVITAMIN W/MINERALS CH
1.0000 | ORAL_TABLET | Freq: Every day | ORAL | Status: DC
  Administered 2013-01-17: 1 via ORAL
  Filled 2013-01-17 (×2): qty 1

## 2013-01-17 MED ORDER — PANTOPRAZOLE SODIUM 40 MG PO TBEC
40.0000 mg | DELAYED_RELEASE_TABLET | Freq: Every day | ORAL | Status: DC
  Administered 2013-01-17: 40 mg via ORAL
  Filled 2013-01-17: qty 1

## 2013-01-17 MED ORDER — CALCIUM CARBONATE 1250 (500 CA) MG PO TABS
1.0000 | ORAL_TABLET | Freq: Two times a day (BID) | ORAL | Status: DC
  Administered 2013-01-17: 500 mg via ORAL
  Filled 2013-01-17 (×6): qty 1

## 2013-01-17 MED ORDER — HEPARIN (PORCINE) IN NACL 100-0.45 UNIT/ML-% IJ SOLN
850.0000 [IU]/h | INTRAMUSCULAR | Status: DC
  Administered 2013-01-17: 850 [IU]/h via INTRAVENOUS
  Filled 2013-01-17: qty 250

## 2013-01-17 MED ORDER — SODIUM CHLORIDE 0.9 % IV SOLN: 250.0000 mL | INTRAVENOUS | Status: DC | PRN

## 2013-01-17 MED ORDER — DILTIAZEM HCL ER COATED BEADS 180 MG PO CP24
180.0000 mg | ORAL_CAPSULE | Freq: Every day | ORAL | Status: DC
  Administered 2013-01-17: 180 mg via ORAL
  Filled 2013-01-17 (×2): qty 1

## 2013-01-17 MED ORDER — LIDOCAINE HCL (PF) 1 % IJ SOLN
INTRAMUSCULAR | Status: AC
  Filled 2013-01-17: qty 30

## 2013-01-17 MED ORDER — HEPARIN (PORCINE) IN NACL 100-0.45 UNIT/ML-% IJ SOLN
700.0000 [IU]/h | INTRAMUSCULAR | Status: DC
  Administered 2013-01-17: 700 [IU]/h via INTRAVENOUS
  Filled 2013-01-17: qty 250

## 2013-01-17 MED ORDER — MIDAZOLAM HCL 2 MG/2ML IJ SOLN
INTRAMUSCULAR | Status: AC
  Filled 2013-01-17: qty 2

## 2013-01-17 MED ORDER — ATORVASTATIN CALCIUM 10 MG PO TABS
10.0000 mg | ORAL_TABLET | Freq: Every day | ORAL | Status: DC
  Filled 2013-01-17: qty 1

## 2013-01-17 MED ORDER — LORAZEPAM 0.5 MG PO TABS
0.5000 mg | ORAL_TABLET | Freq: Three times a day (TID) | ORAL | Status: DC | PRN
  Administered 2013-01-17: 0.5 mg via ORAL
  Filled 2013-01-17: qty 1

## 2013-01-17 NOTE — Interval H&P Note (Signed)
History and Physical Interval Note:  01/17/2013 4:57 PM  Candace Stewart  has presented today for surgery, with the diagnosis of cp  The various methods of treatment have been discussed with the patient and family. After consideration of risks, benefits and other options for treatment, the patient has consented to  Procedure(s): LEFT HEART CATHETERIZATION WITH CORONARY ANGIOGRAM (N/A) and possible PTCA as a surgical intervention .  The patient's history has been reviewed, patient examined, no change in status, stable for surgery.  I have reviewed the patient's chart and labs.  Questions were answered to the patient's satisfaction.     Pamella Pert

## 2013-01-17 NOTE — H&P (Signed)
Candace Stewart is an 72 y.o. female.   Chief Complaint: Chest pain HPI: The patient is a 72 year old female who presents for an evaluation of chest pain on exertion. The onset of the chest pain on exertion has been gradual and has been occurring in an intermittent pattern for 3 years. The chest pain on exertion is described as a moderate discomfort and pressure sensation. The chest pain on exertion is described as being located in the left chest (Left upper chest area.), left sternal border, right chest, right sternal border, substernal area and epigastrium. The chest pain on exertion radiates to neck and the left arm (left forearm and fingers ). The pain is precipitated by exercise (Walking uphill.). The symptoms are aggravated by exertion. The symptoms are relieved by rest. The symptoms have been associated with dyspnea and palpitations. No PND or orthopnea. No hemoptysis. Leg edema No symptoms of claudication or TIA. Patient symptoms have been ongoing for quite some time, however yesterday the chest pain was continuous, on and off started around 10:00 in the morning and eventually she presented to the emergency room around 4 or 5:00 in the evening. It is described as mild lasting few minutes but because of recurrence and mild lingering chest discomfort which was described as "concerning" she presented to the emergency room. Since admission to the hospital, she states that she has not had any recurrence of chest pain. Patient states that she does have GERD and has had esophageal dilatation performed by Dr. Claudette Head sometime in 2010. However this chest and was described as different and although she has had GERD like symptoms, she states that this is been fairly stable, occasionally she does have mild difficulty in swallowing.  Past Medical History  Diagnosis Date  . GERD (gastroesophageal reflux disease)     w/ LPR  . Hiatal hernia   . Depression   . Allergic rhinitis   . Hemorrhoids   .  Osteopenia   . Tubular adenoma of colon 05/2009    Past Surgical History  Procedure Laterality Date  . Tonsillectomy    . Abdominal hysterectomy    . Carpal tunnel release    . Right arm surgery      Family History  Problem Relation Age of Onset  . Colon polyps Sister   . Colon cancer Cousin 60  . Colitis      Mother's side of family  . Diabetes Father   . Diabetes Son   . Diabetes Sister   . Diabetes Brother   . Kidney disease Sister    Social History:  reports that she has quit smoking. Her smoking use included Cigarettes. She smoked 0.00 packs per day. She has never used smokeless tobacco. She reports that she does not drink alcohol or use illicit drugs.  Allergies:  Allergies  Allergen Reactions  . Amoxicillin Diarrhea  . Sulfonamide Derivatives     unknown    Medications Prior to Admission  Medication Sig Dispense Refill  . calcium gluconate 500 MG tablet Take 500 mg by mouth 2 (two) times daily.        Marland Kitchen esomeprazole (NEXIUM) 40 MG capsule Take 40 mg by mouth daily before breakfast.       . LORazepam (ATIVAN) 0.5 MG tablet Take 0.5 mg by mouth every 8 (eight) hours as needed for anxiety.       . Multiple Vitamin (MULTIVITAMIN WITH MINERALS) TABS Take 1 tablet by mouth daily.  Review of Systems - patient is a GERD, denies any shortness of breath, complaints of being fatigued. He is not a diabetic other systems are negative. No TIA or claudication.  Blood pressure 150/84, pulse 78, temperature 98.4 F (36.9 C), temperature source Oral, resp. rate 18, height 4\' 10"  (1.473 m), weight 62.324 kg (137 lb 6.4 oz), SpO2 95.00%.  General:  Alert. General Appearance- Cooperative, Well groomed and Consistent with stated age. Not in acute distress.  Heart: Regular. Heart Sounds- Normal heart sounds. Murmurs & Other Heart Sounds:Auscultation of the heart reveals - No Murmurs. Carotid arteries- No Carotid bruit. Chest: Clear to auscultate. No ronchi. Abdomen:  Soft, non tender, No obvious organomegaly. BS heard in all 4 quadrants.  Extremities: FROM, No edema Neuro: A, Ox3. No gross deficits. Vascular: Pulses are normal  Results for orders placed during the hospital encounter of 01/16/13 (from the past 48 hour(s))  CBC     Status: None   Collection Time    01/16/13  5:51 PM      Result Value Range   WBC 6.4  4.0 - 10.5 K/uL   RBC 4.30  3.87 - 5.11 MIL/uL   Hemoglobin 13.0  12.0 - 15.0 g/dL   HCT 16.1  09.6 - 04.5 %   MCV 90.2  78.0 - 100.0 fL   MCH 30.2  26.0 - 34.0 pg   MCHC 33.5  30.0 - 36.0 g/dL   RDW 40.9  81.1 - 91.4 %   Platelets 283  150 - 400 K/uL  BASIC METABOLIC PANEL     Status: Abnormal   Collection Time    01/16/13  5:51 PM      Result Value Range   Sodium 136  135 - 145 mEq/L   Potassium 3.9  3.5 - 5.1 mEq/L   Chloride 100  96 - 112 mEq/L   CO2 30  19 - 32 mEq/L   Glucose, Bld 106 (*) 70 - 99 mg/dL   BUN 15  6 - 23 mg/dL   Creatinine, Ser 7.82  0.50 - 1.10 mg/dL   Calcium 9.4  8.4 - 95.6 mg/dL   GFR calc non Af Amer 83 (*) >90 mL/min   GFR calc Af Amer >90  >90 mL/min   Comment:            The eGFR has been calculated     using the CKD EPI equation.     This calculation has not been     validated in all clinical     situations.     eGFR's persistently     <90 mL/min signify     possible Chronic Kidney Disease.  PRO B NATRIURETIC PEPTIDE     Status: None   Collection Time    01/16/13  5:51 PM      Result Value Range   Pro B Natriuretic peptide (BNP) 21.8  0 - 125 pg/mL  POCT I-STAT TROPONIN I     Status: None   Collection Time    01/16/13  6:05 PM      Result Value Range   Troponin i, poc 0.00  0.00 - 0.08 ng/mL   Comment 3            Comment: Due to the release kinetics of cTnI,     a negative result within the first hours     of the onset of symptoms does not rule out     myocardial infarction with certainty.  If myocardial infarction is still suspected,     repeat the test at appropriate  intervals.  TROPONIN I     Status: None   Collection Time    01/16/13 10:54 PM      Result Value Range   Troponin I <0.30  <0.30 ng/mL   Comment:            Due to the release kinetics of cTnI,     a negative result within the first hours     of the onset of symptoms does not rule out     myocardial infarction with certainty.     If myocardial infarction is still suspected,     repeat the test at appropriate intervals.  TROPONIN I     Status: None   Collection Time    01/16/13 11:59 PM      Result Value Range   Troponin I <0.30  <0.30 ng/mL   Comment:            Due to the release kinetics of cTnI,     a negative result within the first hours     of the onset of symptoms does not rule out     myocardial infarction with certainty.     If myocardial infarction is still suspected,     repeat the test at appropriate intervals.  TROPONIN I     Status: None   Collection Time    01/17/13  4:24 AM      Result Value Range   Troponin I <0.30  <0.30 ng/mL   Comment:            Due to the release kinetics of cTnI,     a negative result within the first hours     of the onset of symptoms does not rule out     myocardial infarction with certainty.     If myocardial infarction is still suspected,     repeat the test at appropriate intervals.   Dg Chest 2 View  01/16/2013   *RADIOLOGY REPORT*  Clinical Data: Chest pain.  Short of breath.  CHEST - 2 VIEW  Comparison: CT abdomen 10/12/2008.  Findings: Hazy opacity is present along the cardiac apex compatible with prominent left-sided pericardial fat pad seen on prior CT. Cardiopericardial silhouette appears within normal limits. Mediastinal contours normal.  No airspace disease.  No effusion. Monitoring leads are projected over the chest.  Thoracic spondylosis is present.  IMPRESSION: No acute cardiopulmonary disease.   Original Report Authenticated By: Andreas Newport, M.D.    Labs:   Lab Results  Component Value Date   WBC 6.4 01/16/2013    HGB 13.0 01/16/2013   HCT 38.8 01/16/2013   MCV 90.2 01/16/2013   PLT 283 01/16/2013    Recent Labs Lab 01/16/13 1751  NA 136  K 3.9  CL 100  CO2 30  BUN 15  CREATININE 0.74  CALCIUM 9.4  GLUCOSE 106*   Lab Results  Component Value Date   TROPONINI <0.30 01/17/2013   EKG: NSR @ 77/min. Normal intervals. No ischemia. Normal EKG. Stress EKG that was performed about 3 or 4 months ago in my office had revealed no evidence of ischemia. Nuclear perfusion was not performed. No chest pain as described. Assessment/Plan 1. Chest pain suggestive of exertional angina pectoris with radiation to the left neck, left arm. However patient has normal EKG, normal physical examination and cardiac markers were negative for myocardial injury. 2. History of GERD, hiatal  hernia and patient is the history that she has had esophageal dilatations performed in the past. Follows Dr. Claudette Head.  Recommendation: Patient without any significant cardiovascular risk, however patient gives a classic history of exertional chest discomfort. I have given him options of stopping the IV heparin, ambulating, changing her PPI to a different agent and reevaluating the symptoms versus proceeding with cardiac catheterization. Patient and her daughter at the bedside are extremely concerned due to her recurrence of chest discomfort and would like to proceed with cardiac catheterization during this admission. They understand less than 1% risk of death, stroke, MI, need for urgent CABG but not limited to these as major complication. I will set her up for a cardiac catheterization this afternoon and if normal, she'll need further evaluation for noncardiac evaluation of chest pain, specifically GI evaluation. Further recommendations will follow after the cardiac catheterization.  Pamella Pert, MD 01/17/2013, 5:49 AM [facility] Pager: [phone] Office: [phone] If no answer: Cell:  [phone]

## 2013-01-17 NOTE — Progress Notes (Signed)
Utilization review completed.  

## 2013-01-17 NOTE — Discharge Summary (Addendum)
Physician Discharge Summary  Patient ID: Candace Stewart MRN: [id number] DOB/AGE: [id number] 73 y.o.  Admit date: 01/16/2013 Discharge date: 01/17/2013  Primary Discharge Diagnosis  Chest pain due to esophageal spasm. 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 Secondary Discharge Diagnosis GERD Hiatal Hernia H/O esophageal stricture  Significant Diagnostic Studies: 01/17/2013: Left ventricle: Performed in the RAO projection revealed LVEF of 80%. The LV cavity was small. There was No significant MR. No wall motion abnormality.  Right coronary artery: The vessel is smooth, normal, Dominant.  Left main coronary artery is large and normal.  Circumflex coronary artery: A large vessel giving origin to a large obtuse marginal 1. It is smooth and normal.  LAD: LAD gives origin to a large diagonal-1. 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  Hospital Course: Admitted as [place] and ruled out for MI. Underwent cardiac cath and found to have no significant CAD but showed mid LAD intramyocardial bridging.  Discharged on Cardizem CD due to hyperdynamic LV and and elevated LVEDP to improve diastolic dysfunction with OP f/u. If chest pain persist, consider GI evaluation. Due to inability to obtain hemostasis at the right radial arterial access site and as it was late, patient was kept overnight and discharged home the following morning.  She did complain of visual disturbances, in both eyes, without any focal neurological deficits, which resolved.  There is no clinical suspicion for TIA.  Patient was discharged home as she remained stable per nursing.  Discharge Exam: Blood pressure 137/67, pulse 91, temperature 98.3 F (36.8 C), temperature source Oral, resp. rate 18, height 4\' 10"  (1.473 m), weight 62.324 kg (137 lb 6.4 oz), SpO2 99.00%.  General: Alert. General Appearance - Cooperative, Well groomed and Consistent with stated age. Not in acute distress.  Heart: Regular. Heart  Sounds - Normal heart sounds. Murmurs & Other Heart Sounds: Auscultation of the heart reveals - No Murmurs.  Carotid arteries - No Carotid bruit.  Chest: Clear to auscultate. No ronchi.  Abdomen: Soft, non tender, No obvious organomegaly. BS heard in all 4 quadrants.  Extremities: FROM, No edema  Neuro: A, Ox3. No gross deficits.  Vascular: Pulses are normal  Labs:   Lab Results  Component Value Date   WBC 6.4 01/16/2013   HGB 13.0 01/16/2013   HCT 38.8 01/16/2013   MCV 90.2 01/16/2013   PLT 283 01/16/2013    Recent Labs Lab 01/16/13 1751  NA 136  K 3.9  CL 100  CO2 30  BUN 15  CREATININE 0.74  CALCIUM 9.4  GLUCOSE 106*   Lab Results  Component Value Date   TROPONINI <0.30 01/17/2013     EKG: NSR @ 77/min. Normal intervals. No ischemia. Normal EKG.    Radiology: Dg Chest 2 View  01/16/2013   *RADIOLOGY REPORT*  Clinical Data: Chest pain.  Short of breath.  CHEST - 2 VIEW  Comparison: CT abdomen 10/12/2008.  Findings: Hazy opacity is present along the cardiac apex compatible with prominent left-sided pericardial fat pad seen on prior CT. Cardiopericardial silhouette appears within normal limits. Mediastinal contours normal.  No airspace disease.  No effusion. Monitoring leads are projected over the chest.  Thoracic spondylosis is present.  IMPRESSION: No acute cardiopulmonary disease.   Original Report Authenticated By: Andreas Newport, M.D.    FOLLOW UP PLANS AND APPOINTMENTS    Medication List    TAKE these medications       calcium gluconate 500 MG tablet  Take 500 mg by  mouth 2 (two) times daily.     diltiazem 180 MG 24 hr capsule  Commonly known as:  CARDIZEM CD  Take 1 capsule (180 mg total) by mouth daily.     esomeprazole 40 MG capsule  Commonly known as:  NEXIUM  Take 40 mg by mouth daily before breakfast.     LORazepam 0.5 MG tablet  Commonly known as:  ATIVAN  Take 0.5 mg by mouth every 8 (eight) hours as needed for anxiety.     multivitamin with minerals  Tabs  Take 1 tablet by mouth daily.           Follow-up Information   Call [facility], MD. (To be seen in 2 weeks)    Contact information:   [address] [place] [address] [phone]        Pamella Pert, MD 01/17/2013, 5:45 PM  Pager: [phone] Office: [phone] If no answer: [phone]

## 2013-01-17 NOTE — Progress Notes (Signed)
ANTICOAGULATION CONSULT NOTE - Follow Up Consult  Pharmacy Consult for Heparin Indication: chest pain/ACS  Allergies  Allergen Reactions  . Amoxicillin Diarrhea  . Sulfonamide Derivatives     unknown    Patient Measurements: Height: 4\' 10"  (147.3 cm) Weight: 137 lb 6.4 oz (62.324 kg) IBW/kg (Calculated) : 40.9 Heparin Dosing Weight: 55.5 kg  Vital Signs: Temp: 98.5 F (36.9 C) (06/09 0700) Temp src: Oral (06/08 2139) BP: 115/73 mmHg (06/09 0700) Pulse Rate: 72 (06/09 0700)  Labs:  Recent Labs  01/16/13 1751 01/16/13 2254 01/16/13 2359 01/17/13 0424 01/17/13 0740  HGB 13.0  --   --   --   --   HCT 38.8  --   --   --   --   PLT 283  --   --   --   --   HEPARINUNFRC  --   --   --   --  1.02*  CREATININE 0.74  --   --   --   --   TROPONINI  --  <0.30 <0.30 <0.30  --     Estimated Creatinine Clearance: 49.7 ml/min (by C-G formula based on Cr of 0.74).   Assessment: 72 y/o female admitted with CP/SOB. Troponins x 3 negative.  Anticoagulation: Heparin level 1.02 elevated. No CBC this am.  Cardiovascular: 115/73, HR 72 on ASA, Lipitor, Coreg  Gastrointestinal / Nutrition: h/o HH, GERD, hemorrhoids on PPI, MV/Ca suppl.  Neurology: h/o depression ,  Nephrology: Scr 0.74  Hematology / Oncology: h/o colon cancer  PTA Medication Issues: no issues   Goal of Therapy:  Heparin level 0.3-0.7 units/ml Monitor platelets by anticoagulation protocol: Yes   Plan:  Hold heparin x 1 hrs (started 0830) Resume at 700 units/hr F/u after cath vs recheck heparin level in 6 hrs.   Greco Gastelum S. Merilynn Finland, PharmD, BCPS Clinical Staff Pharmacist Pager [phone]  Misty Stanley Stillinger 01/17/2013,8:22 AM

## 2013-01-17 NOTE — Discharge Instructions (Signed)
Chest Pain (Nonspecific) °It is often hard to give a specific diagnosis for the cause of chest pain. There is always a chance that your pain could be related to something serious, such as a heart attack or a blood clot in the lungs. You need to follow up with your caregiver for further evaluation. °CAUSES  °· Heartburn. °· Pneumonia or bronchitis. °· Anxiety or stress. °· Inflammation around your heart (pericarditis) or lung (pleuritis or pleurisy). °· A blood clot in the lung. °· A collapsed lung (pneumothorax). It can develop suddenly on its own (spontaneous pneumothorax) or from injury (trauma) to the chest. °· Shingles infection (herpes zoster virus). °The chest wall is composed of bones, muscles, and cartilage. Any of these can be the source of the pain. °· The bones can be bruised by injury. °· The muscles or cartilage can be strained by coughing or overwork. °· The cartilage can be affected by inflammation and become sore (costochondritis). °DIAGNOSIS  °Lab tests or other studies, such as X-rays, electrocardiography, stress testing, or cardiac imaging, may be needed to find the cause of your pain.  °TREATMENT  °· Treatment depends on what may be causing your chest pain. Treatment may include: °· Acid blockers for heartburn. °· Anti-inflammatory medicine. °· Pain medicine for inflammatory conditions. °· Antibiotics if an infection is present. °· You may be advised to change lifestyle habits. This includes stopping smoking and avoiding alcohol, caffeine, and chocolate. °· You may be advised to keep your head raised (elevated) when sleeping. This reduces the chance of acid going backward from your stomach into your esophagus. °· Most of the time, nonspecific chest pain will improve within 2 to 3 days with rest and mild pain medicine. °HOME CARE INSTRUCTIONS  °· If antibiotics were prescribed, take your antibiotics as directed. Finish them even if you start to feel better. °· For the next few days, avoid physical  activities that bring on chest pain. Continue physical activities as directed. °· Do not smoke. °· Avoid drinking alcohol. °· Only take over-the-counter or prescription medicine for pain, discomfort, or fever as directed by your caregiver. °· Follow your caregiver's suggestions for further testing if your chest pain does not go away. °· Keep any follow-up appointments you made. If you do not go to an appointment, you could develop lasting (chronic) problems with pain. If there is any problem keeping an appointment, you must call to reschedule. °SEEK MEDICAL CARE IF:  °· You think you are having problems from the medicine you are taking. Read your medicine instructions carefully. °· Your chest pain does not go away, even after treatment. °· You develop a rash with blisters on your chest. °SEEK IMMEDIATE MEDICAL CARE IF:  °· You have increased chest pain or pain that spreads to your arm, neck, jaw, back, or abdomen. °· You develop shortness of breath, an increasing cough, or you are coughing up blood. °· You have severe back or abdominal pain, feel nauseous, or vomit. °· You develop severe weakness, fainting, or chills. °· You have a fever. °THIS IS AN EMERGENCY. Do not wait to see if the pain will go away. Get medical help at once. Call your local emergency services (911 in [place] Do not drive yourself to the hospital. °MAKE SURE YOU:  °· Understand these instructions. °· Will watch your condition. °· Will get help right away if you are not doing well or get worse. °Document Released: 05/07/2005 Document Revised: 10/20/2011 Document Reviewed: 03/02/2008 °Exit[employer]® Patient Information ©2014 [employer]   [employer]. ° °

## 2013-01-17 NOTE — CV Procedure (Signed)
Procedure performed:  Left heart catheterization including hemodynamic monitoring of the left ventricle, LV gram, selective right and left coronary arteriography.  Indication patient is a 72 year-old female with chest pain with radiation to the left arm and neck. She has exertional component to this but yesterday, she had continuous chest pain on and off suggestive of [place].  Hence is brought to the cardiac catheterization lab to evaluate the  coronary anatomy for definitive diagnosis of CAD.  Hemodynamic data:  Left ventricular pressure was 141/7 with LVEDP of 12 mm mercury. Aortic pressure was 155/77 with a mean of 114 mm mercury. There was no pressure gradient across the aortic valve  Left ventricle: Performed in the RAO projection revealed LVEF of 80%. The LV cavity was small. There was No significant MR. No wall motion abnormality.  Right coronary artery: The vessel is smooth, normal,  Dominant.  Left main coronary artery is large and normal.  Circumflex coronary artery: A large vessel giving origin to a large obtuse marginal 1. It is smooth and normal.   LAD:  LAD gives origin to a large diagonal-1.  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  Technique: Under sterile precautions using a 5 French right radial  arterial access, a 6 French sheath was introduced into the right radial artery. A 5 [place] Tig 4 catheter was advanced into the ascending aorta selective  right coronary artery and left coronary artery was cannulated and angiography was performed in multiple views. The catheter was pulled back Out of the body over exchange length J-wire. 4F pigtail Catheter was used to perform LV gram which was performed in RAO projection. Catheter exchanged out of the body over J-Wire. NO immediate complications noted. Patient tolerated the procedure well.   Rec: I will add calcium channel blocker for hyperdynamic LV and intramyocardial bridging and hope this will improve her chest pain. It may also help  with esophageal spasm. Chest pain could be due to myocardial bridging vs GI etiology.  Disposition: Will be discharged home today with outpatient follow up.

## 2013-01-18 LAB — CBC
HCT: 36.4 % (ref 36.0–46.0)
Hemoglobin: 12.6 g/dL (ref 12.0–15.0)
MCHC: 34.6 g/dL (ref 30.0–36.0)
MCV: 87.7 fL (ref 78.0–100.0)
RDW: 13.1 % (ref 11.5–15.5)

## 2013-01-18 NOTE — Progress Notes (Signed)
Pt's cath site bleeding after removal of 3cc of air for the second time, Dr. Jacinto Halim informed, orders received to keep pt overnight to watch until morning.

## 2013-01-18 NOTE — Progress Notes (Signed)
TR BAND REMOVAL  LOCATION:    right radial  DEFLATED PER PROTOCOL:    yes  TIME BAND OFF / DRESSING APPLIED:    22:30   SITE UPON ARRIVAL:    Level 0  SITE AFTER BAND REMOVAL:    Level 0  REVERSE ALLEN'S TEST:     positive  CIRCULATION SENSATION AND MOVEMENT:    Within Normal Limits   yes  COMMENTS:   Pt tolerated removal of TR band without complication, no distress, VSS, will continue to monitor pt.

## 2013-03-16 ENCOUNTER — Other Ambulatory Visit: Payer: Self-pay

## 2013-06-02 ENCOUNTER — Other Ambulatory Visit: Payer: Self-pay

## 2013-06-02 DIAGNOSIS — Z1231 Encounter for screening mammogram for malignant neoplasm of breast: Secondary | ICD-10-CM

## 2013-06-16 ENCOUNTER — Other Ambulatory Visit: Payer: Self-pay

## 2013-07-04 ENCOUNTER — Ambulatory Visit: Payer: Medicare Other

## 2013-07-04 ENCOUNTER — Ambulatory Visit
Admission: RE | Admit: 2013-07-04 | Discharge: 2013-07-04 | Disposition: A | Discharge status: Home / Self Care | Payer: Medicare Other | Source: Ambulatory Visit

## 2013-07-04 DIAGNOSIS — Z1231 Encounter for screening mammogram for malignant neoplasm of breast: Secondary | ICD-10-CM

## 2014-04-06 ENCOUNTER — Encounter: Payer: Self-pay | Admitting: Gastroenterology

## 2014-05-01 ENCOUNTER — Encounter: Payer: Self-pay | Admitting: Gastroenterology

## 2014-05-01 ENCOUNTER — Ambulatory Visit (INDEPENDENT_AMBULATORY_CARE_PROVIDER_SITE_OTHER): Payer: Medicare Other | Admitting: Gastroenterology

## 2014-05-01 VITALS — BP 140/82 | HR 76 | Ht <= 58 in | Wt 139.4 lb

## 2014-05-01 DIAGNOSIS — Z86010 Personal history of colonic polyps: Secondary | ICD-10-CM

## 2014-05-01 DIAGNOSIS — K219 Gastro-esophageal reflux disease without esophagitis: Secondary | ICD-10-CM

## 2014-05-01 MED ORDER — PEG-KCL-NACL-NASULF-NA ASC-C 100 G PO SOLR: 1.0000 | Freq: Once | ORAL | Status: DC

## 2014-05-01 NOTE — Progress Notes (Signed)
    History of Present Illness: This is a 73 year old female with chronic GERD and history of adenomatous colon polyps. Her last colonoscopy was in October 2010. She has a sister with colon polyps and a cousin who had colon cancer. Patient recently notes a flare in her reflux symptoms with heartburn and some pain with swallowing. All symptoms have resolved after more closely following standard antireflux measures. Denies weight loss, abdominal pain, constipation, diarrhea, change in stool caliber, melena, hematochezia, nausea, vomiting, dysphagia, chest pain.  Review of Systems: Pertinent positive and negative review of systems were noted in the above HPI section. All other review of systems were otherwise negative.  Current Medications, Allergies, Past Medical History, Past Surgical History, Family History and Social History were reviewed in [employer] record.  Physical Exam: General: Well developed , well nourished, no acute distress Head: Normocephalic and atraumatic Eyes:  sclerae anicteric, EOMI Ears: Normal auditory acuity Mouth: No deformity or lesions Neck: Supple, no masses or thyromegaly Lungs: Clear throughout to auscultation Heart: Regular rate and rhythm; no murmurs, rubs or bruits Abdomen: Soft, non tender and non distended. No masses, hepatosplenomegaly or hernias noted. Normal Bowel sounds Rectal: Deferred to colonoscopy Musculoskeletal: Symmetrical with no gross deformities  Skin: No lesions on visible extremities Pulses:  Normal pulses noted Extremities: No clubbing, cyanosis, edema or deformities noted Neurological: Alert oriented x 4, grossly nonfocal Cervical Nodes:  No significant cervical adenopathy Inguinal Nodes: No significant inguinal adenopathy Psychological:  Alert and cooperative. Normal mood and affect  Assessment and Recommendations:  1. Personal history of adenomatous colon polyps, first degree relative with colon polyps, cousin  with colon cancer. Schedule colonoscopy. The risks, benefits, and alternatives to colonoscopy with possible biopsy and possible polypectomy were discussed with the patient and they consent to proceed.   2. GERD flare, resolved. Continue all standard antireflux measures and Nexium 40 mg by mouth every morning. Nexium OTC or Pepcid AC every afternoon/evening as needed for breakthrough symptoms.

## 2014-05-01 NOTE — Patient Instructions (Signed)
You have been scheduled for a colonoscopy. Please follow written instructions given to you at your visit today.  Please pick up your prep kit at the pharmacy within the next 1-3 days. If you use inhalers (even only as needed), please bring them with you on the day of your procedure.  Patient advised to avoid spicy, acidic, citrus, chocolate, mints, fruit and fruit juices.  Limit the intake of caffeine, alcohol and Soda.  Don't exercise too soon after eating.  Don't lie down within 3-4 hours of eating.  Elevate the head of your bed.  Thank you for choosing me and [facility].  Pricilla Riffle. Dagoberto Ligas., MD., Marval Regal  cc: Jani Gravel, MD

## 2014-05-25 ENCOUNTER — Encounter: Payer: Medicare Other | Admitting: Gastroenterology

## 2014-07-20 ENCOUNTER — Encounter (HOSPITAL_COMMUNITY): Payer: Self-pay | Admitting: Cardiology

## 2014-10-12 ENCOUNTER — Encounter: Payer: Self-pay | Admitting: Gastroenterology

## 2014-12-18 ENCOUNTER — Other Ambulatory Visit: Payer: Self-pay

## 2014-12-18 DIAGNOSIS — Z1231 Encounter for screening mammogram for malignant neoplasm of breast: Secondary | ICD-10-CM

## 2015-01-10 ENCOUNTER — Encounter: Payer: Self-pay | Admitting: Gastroenterology

## 2015-01-10 ENCOUNTER — Ambulatory Visit
Admission: RE | Admit: 2015-01-10 | Discharge: 2015-01-10 | Disposition: A | Discharge status: Home / Self Care | Payer: Medicare Other | Source: Ambulatory Visit

## 2015-01-10 DIAGNOSIS — Z1231 Encounter for screening mammogram for malignant neoplasm of breast: Secondary | ICD-10-CM

## 2015-02-05 ENCOUNTER — Other Ambulatory Visit: Payer: Self-pay

## 2017-01-20 ENCOUNTER — Encounter: Payer: Self-pay | Admitting: Podiatry

## 2017-01-20 ENCOUNTER — Ambulatory Visit (INDEPENDENT_AMBULATORY_CARE_PROVIDER_SITE_OTHER): Payer: Medicare Other | Admitting: Podiatry

## 2017-01-20 ENCOUNTER — Ambulatory Visit (INDEPENDENT_AMBULATORY_CARE_PROVIDER_SITE_OTHER): Payer: Medicare Other

## 2017-01-20 DIAGNOSIS — M722 Plantar fascial fibromatosis: Secondary | ICD-10-CM

## 2017-01-20 DIAGNOSIS — N8110 Cystocele, unspecified: Secondary | ICD-10-CM | POA: Insufficient documentation

## 2017-01-20 MED ORDER — MELOXICAM 15 MG PO TABS: 15.0000 mg | ORAL_TABLET | Freq: Every day | ORAL | 3 refills | Status: DC

## 2017-01-20 MED ORDER — METHYLPREDNISOLONE 4 MG PO TBPK: ORAL_TABLET | ORAL | 0 refills | Status: DC

## 2017-01-20 NOTE — Progress Notes (Signed)
   Subjective:    Patient ID: Candace Stewart, female    DOB: 12-21-1940, 76 y.o.   MRN: [id number]  HPI: Presents today with chief complaint of heel pain right she states is been aching for about 6 months mornings are particularly bad she states that she walks a lot for exercise however she wears flip-flops and sandals to walk in. She states that she hasn't been able to do much lately and she has not treated it on her own.    Review of Systems  HENT: Positive for tinnitus.   Gastrointestinal: Positive for abdominal distention.  Musculoskeletal: Positive for arthralgias.  All other systems reviewed and are negative.      Objective:   Physical Exam: Vital signs are stable she is alert and oriented 3 are reviewed her past medical history medications allergy surgery social history and review of systems. Pulses are strongly palpable. Neurologic sensorium is intact. Deep tendon reflexes are intact. Muscle strength +5 over 5 dorsiflexion plantar flexors and inverters everters all intrinsic musculature is intact. Orthopedic evaluation of his x-rays all joints distal to the ankle for range of motion without crepitation. She has pain on palpation medial tubercle of the right heel. Radiographs taken today do demonstrate a distally oriented plantar calcaneal heel spur with a soft tissue increase in density of the plantar fascia calcaneal insertion site. No open lesions or wounds are noted on cutaneous evaluation.        Assessment & Plan:  Plantar fasciitis right foot.  Plan: Discussed etiology pathology conservative versus surgical therapies. Injected the right heel today with Kenalog and local anesthetic started her on a Medrol Dosepak to be followed by meloxicam. Posterior plantar fascia brace and a night splint. Discussed appropriate shoe gear stretching as she sizes ice therapy shoe modifications. Follow up with her in 1 month.

## 2017-01-20 NOTE — Patient Instructions (Signed)

## 2017-02-24 ENCOUNTER — Ambulatory Visit: Payer: Medicare Other | Admitting: Podiatry

## 2017-06-02 ENCOUNTER — Other Ambulatory Visit: Payer: Self-pay | Admitting: Internal Medicine

## 2017-06-02 DIAGNOSIS — Z1231 Encounter for screening mammogram for malignant neoplasm of breast: Secondary | ICD-10-CM

## 2017-06-30 ENCOUNTER — Ambulatory Visit
Admission: RE | Admit: 2017-06-30 | Discharge: 2017-06-30 | Disposition: A | Discharge status: Home / Self Care | Payer: Medicare Other | Source: Ambulatory Visit | Attending: Internal Medicine | Admitting: Internal Medicine

## 2017-06-30 DIAGNOSIS — Z1231 Encounter for screening mammogram for malignant neoplasm of breast: Secondary | ICD-10-CM

## 2017-10-02 ENCOUNTER — Other Ambulatory Visit: Payer: Self-pay | Admitting: Internal Medicine

## 2017-10-02 DIAGNOSIS — K219 Gastro-esophageal reflux disease without esophagitis: Secondary | ICD-10-CM

## 2017-10-07 ENCOUNTER — Ambulatory Visit
Admission: RE | Admit: 2017-10-07 | Discharge: 2017-10-07 | Disposition: A | Discharge status: Home / Self Care | Payer: Medicare Other | Source: Ambulatory Visit | Attending: Internal Medicine | Admitting: Internal Medicine

## 2017-10-07 DIAGNOSIS — K219 Gastro-esophageal reflux disease without esophagitis: Secondary | ICD-10-CM

## 2018-07-01 ENCOUNTER — Other Ambulatory Visit: Payer: Self-pay | Admitting: Internal Medicine

## 2018-07-01 DIAGNOSIS — Z1231 Encounter for screening mammogram for malignant neoplasm of breast: Secondary | ICD-10-CM

## 2018-07-27 ENCOUNTER — Ambulatory Visit
Admission: RE | Admit: 2018-07-27 | Discharge: 2018-07-27 | Disposition: A | Discharge status: Home / Self Care | Payer: Medicare Other | Source: Ambulatory Visit | Attending: Internal Medicine | Admitting: Internal Medicine

## 2018-07-27 DIAGNOSIS — Z1231 Encounter for screening mammogram for malignant neoplasm of breast: Secondary | ICD-10-CM

## 2018-11-03 ENCOUNTER — Ambulatory Visit: Payer: Self-pay | Admitting: Cardiology

## 2019-08-23 ENCOUNTER — Ambulatory Visit: Payer: Medicare Other | Attending: Internal Medicine

## 2019-08-23 DIAGNOSIS — Z20822 Contact with and (suspected) exposure to covid-19: Secondary | ICD-10-CM

## 2019-08-25 LAB — NOVEL CORONAVIRUS, NAA: SARS-CoV-2, NAA: DETECTED — AB

## 2019-08-27 ENCOUNTER — Ambulatory Visit: Payer: Medicare Other

## 2019-10-09 ENCOUNTER — Ambulatory Visit: Payer: Medicare Other | Attending: Internal Medicine

## 2019-10-09 DIAGNOSIS — Z23 Encounter for immunization: Secondary | ICD-10-CM

## 2019-10-09 NOTE — Progress Notes (Signed)
   Covid-19 Vaccination Clinic  Name:  Candace Stewart    MRN: [id number] DOB: 12-04-1940  10/09/2019  [name] was observed post Covid-19 immunization for 15 minutes without incidence. She was provided with Vaccine Information Sheet and instruction to access the V-Safe system.   [name] was instructed to call 911 with any severe reactions post vaccine: Marland Kitchen Difficulty breathing  . Swelling of your face and throat  . A fast heartbeat  . A bad rash all over your body  . Dizziness and weakness    Immunizations Administered    Name Date Dose VIS Date Route   Pfizer COVID-19 Vaccine 10/09/2019  9:17 AM 0.3 mL 07/22/2019 Intramuscular   Manufacturer: [place]   Lot: [id number]   [place]: [id number]

## 2019-10-25 LAB — COLOGUARD

## 2019-11-02 ENCOUNTER — Ambulatory Visit: Payer: Medicare Other | Attending: Internal Medicine

## 2019-11-02 DIAGNOSIS — Z23 Encounter for immunization: Secondary | ICD-10-CM

## 2019-11-02 NOTE — Progress Notes (Signed)
   Covid-19 Vaccination Clinic  Name:  Candace Stewart    MRN: [id number] DOB: 09-Oct-1940  11/02/2019  Candace Stewart was observed post Covid-19 immunization for 15 minutes without incident. She was provided with Vaccine Information Sheet and instruction to access the V-Safe system.   Candace Stewart was instructed to call 911 with any severe reactions post vaccine: Marland Kitchen Difficulty breathing  . Swelling of face and throat  . A fast heartbeat  . A bad rash all over body  . Dizziness and weakness   Immunizations Administered    Name Date Dose VIS Date Route   Pfizer COVID-19 Vaccine 11/02/2019 11:03 AM 0.3 mL 07/22/2019 Intramuscular   Manufacturer: [place]   Lot: [id number]   [place]: [id number]

## 2019-11-07 ENCOUNTER — Other Ambulatory Visit: Payer: Self-pay | Admitting: Internal Medicine

## 2019-11-07 DIAGNOSIS — Z1231 Encounter for screening mammogram for malignant neoplasm of breast: Secondary | ICD-10-CM

## 2019-11-14 ENCOUNTER — Other Ambulatory Visit: Payer: Self-pay

## 2019-11-14 NOTE — Progress Notes (Signed)
Information from Referral paperwork loaded

## 2019-11-16 ENCOUNTER — Ambulatory Visit (INDEPENDENT_AMBULATORY_CARE_PROVIDER_SITE_OTHER): Payer: Medicare Other | Admitting: Gastroenterology

## 2019-11-16 ENCOUNTER — Encounter: Payer: Self-pay | Admitting: Gastroenterology

## 2019-11-16 ENCOUNTER — Other Ambulatory Visit: Payer: Self-pay

## 2019-11-16 VITALS — BP 118/80 | HR 76 | Temp 98.0°F | Ht <= 58 in | Wt 147.6 lb

## 2019-11-16 DIAGNOSIS — Z86010 Personal history of colonic polyps: Secondary | ICD-10-CM

## 2019-11-16 DIAGNOSIS — K219 Gastro-esophageal reflux disease without esophagitis: Secondary | ICD-10-CM

## 2019-11-16 DIAGNOSIS — R195 Other fecal abnormalities: Secondary | ICD-10-CM | POA: Diagnosis not present

## 2019-11-16 DIAGNOSIS — R1310 Dysphagia, unspecified: Secondary | ICD-10-CM | POA: Diagnosis not present

## 2019-11-16 MED ORDER — PEG 3350-KCL-NA BICARB-NACL 420 G PO SOLR: 4000.0000 mL | Freq: Once | ORAL | 0 refills | Status: AC

## 2019-11-16 NOTE — Progress Notes (Signed)
11/16/2019 Candace GOLLIHAR [id number] [birth year]   HISTORY OF PRESENT ILLNESS:  This is a pleasant 79 year old female who is a patient of Dr. Lynne Leader, last seen here in 2015.  Last colonoscopy was October 2010 at which time 1 polyp was removed that was a tubular adenoma.  She was due for colonoscopy recall in 2015.  Was scheduled at her 24 OV but then she had to cancel and never rescheduled.  EGD October 2010 with small hiatal hernia and tortuous esophagus.  Savory dilator was passed for complaints of dysphagia, but no stricture was present.  Here today at the request of her PCP, Dr. Maudie Mercury, for evaluation regarding positive Cologuard study.  Patient denies seeing any blood in her stools.  Says that she's had constipation for years, but nothing has changed or is different about that.  Hgb is normal.  She has long-standing acid reflux issues as well.  Has been on Nexium 40 mg daily for over 20 years.  Takes her Nexium at night around 8-9 PM.  Feels that symptoms have been worse lately.  Having problems swallowing again.  Mostly liquids feel like they get clogged on occasion.  No dysphagia to solid food.      Past Medical History:  Diagnosis Date  . Allergic rhinitis   . Anxiety   . Depression   . Fibrocystic breast disease   . GERD (gastroesophageal reflux disease)    w/ LPR  . Hemorrhoids   . Hiatal hernia    EGD  . Hypercholesteremia   . Hypertension   . Mild depression ([place])   . Osteopenia   . Prolapsed uterus 1973   with TAH  . Tubular adenoma of colon 05/2009   Past Surgical History:  Procedure Laterality Date  . ABDOMINAL HYSTERECTOMY    . CARPAL TUNNEL RELEASE    . LEFT HEART CATHETERIZATION WITH CORONARY ANGIOGRAM N/A 01/17/2013   Procedure: LEFT HEART CATHETERIZATION WITH CORONARY ANGIOGRAM;  Surgeon: Laverda Page, MD;  Location[facility] C[facility] LAB;  Service: Cardiovascular;  Laterality: N/A;  . pilonidal cyst surgery     age 38  . right arm surgery    .  TONSILLECTOMY      reports that she has quit smoking. Her smoking use included cigarettes. She has never used smokeless tobacco. She reports that she does not drink alcohol or use drugs. family history includes Colitis in an other family member; Colon cancer (age of onset: 68) in her cousin; Colon polyps in her sister; Diabetes in her brother, father, sister, and son; Heart failure in her mother; Kidney disease in her sister; Osteoporosis in her mother; Pulmonary Hypertension in her sister; Stroke in her sister. Allergies  Allergen Reactions  . Amoxicillin Diarrhea  . Sulfonamide Derivatives     unknown      Outpatient Encounter Medications as of 11/16/2019  Medication Sig  . acetaminophen (TYLENOL) 325 MG tablet Take 650 mg by mouth every 6 (six) hours as needed.  . Calcium Carbonate-Vitamin D3 (CALCIUM 600-D) 600-400 MG-UNIT TABS Take by mouth 2 (two) times daily.  . calcium gluconate 500 MG tablet Take 500 mg by mouth 2 (two) times daily.    Marland Kitchen esomeprazole (NEXIUM) 40 MG capsule Take 40 mg by mouth daily before breakfast.   . LORazepam (ATIVAN) 0.5 MG tablet Take 0.5 mg by mouth every 8 (eight) hours as needed for anxiety.   . metoprolol succinate (TOPROL-XL) 25 MG 24 hr tablet Take 25 mg by mouth  daily.  . Multiple Vitamin (MULTIVITAMIN WITH MINERALS) TABS Take 1 tablet by mouth daily.  . [DISCONTINUED] meloxicam (MOBIC) 15 MG tablet Take 1 tablet (15 mg total) by mouth daily.  . [DISCONTINUED] methylPREDNISolone (MEDROL DOSEPAK) 4 MG TBPK tablet 6 day dose pack - take as directed   No facility-administered encounter medications on file as of 11/16/2019.     REVIEW OF SYSTEMS  : All other systems reviewed and negative except where noted in the History of Present Illness.   PHYSICAL EXAM: BP 118/80   Pulse 76   Temp 98 F (36.7 C)   Ht 4\' 10"  (1.473 m)   Wt 147 lb 9.6 oz (67 kg)   BMI 30.85 kg/m  General: Well developed white female in no acute distress Head: Normocephalic and  atraumatic Eyes:  Sclerae anicteric, conjunctiva pink. Ears: Normal auditory acuity Lungs: Clear throughout to auscultation; no increased WOB. Heart: Regular rate and rhythm; no M/R/G. Abdomen: Soft, non-distended.  BS present.  Non-tender. Rectal:  Will be done at the time of colonoscopy. Musculoskeletal: Symmetrical with no gross deformities  Skin: No lesions on visible extremities Extremities: No edema  Neurological: Alert oriented x 4, grossly non-focal Psychological:  Alert and cooperative. Normal mood and affect  ASSESSMENT AND PLAN: *Positive Cologuard study with personal history of adenomatous colon polyps:  Last colonoscopy 2010.  Will schedule colonoscopy with Dr. Fuller Plan. *GERD and dysphagia:  Dysphagia mostly just to liquids, suspect dysmotility.  Had empiric Savary dilation in the past, but no stricture present.  Will plan for EGD with possible repeat dilation as well.  Is on Nexium and has been on that for years.  Takes it at bedtime.  I asked her to begin taking it 30-60 minutes before dinner.  **The risks, benefits, and alternatives to EGD with dilation and colonoscopy were discussed with the patient and she consents to proceed.    CC:  Jani Gravel, MD

## 2019-11-16 NOTE — Patient Instructions (Addendum)
If you are age 80 or older, your body mass index should be between 23-30. Your Body mass index is 30.85 kg/m. If this is out of the aforementioned range listed, please consider follow up with your Primary Care Provider.  If you are age 9 or younger, your body mass index should be between 19-25. Your Body mass index is 30.85 kg/m. If this is out of the aformentioned range listed, please consider follow up with your Primary Care Provider.   You have been scheduled for an endoscopy and colonoscopy. Please follow the written instructions given to you at your visit today. Please pick up your prep supplies at the pharmacy within the next 1-3 days. If you use inhalers (even only as needed), please bring them with you on the day of your procedure. Your physician has requested that you go to www.startemmi.com and enter the access code given to you at your visit today. This web site gives a general overview about your procedure. However, you should still follow specific instructions given to you by our office regarding your preparation for the procedure.    Thank you for entrusting me with your care and for choosing [employer], Alonza Bogus, P.A. - C.

## 2019-11-21 NOTE — Progress Notes (Signed)
Reviewed and agree with management plan.  Sylas Twombly T. Eleina Jergens, MD FACG [facility]  

## 2019-12-14 ENCOUNTER — Ambulatory Visit
Admission: RE | Admit: 2019-12-14 | Discharge: 2019-12-14 | Disposition: A | Discharge status: Home / Self Care | Payer: Medicare Other | Source: Ambulatory Visit | Attending: Internal Medicine | Admitting: Internal Medicine

## 2019-12-14 ENCOUNTER — Other Ambulatory Visit: Payer: Self-pay

## 2019-12-14 DIAGNOSIS — Z1231 Encounter for screening mammogram for malignant neoplasm of breast: Secondary | ICD-10-CM

## 2019-12-19 ENCOUNTER — Telehealth: Payer: Self-pay | Admitting: Internal Medicine

## 2019-12-19 ENCOUNTER — Encounter: Payer: Self-pay | Admitting: Gastroenterology

## 2019-12-19 ENCOUNTER — Ambulatory Visit (AMBULATORY_SURGERY_CENTER): Payer: Medicare Other | Admitting: Gastroenterology

## 2019-12-19 ENCOUNTER — Other Ambulatory Visit: Payer: Self-pay

## 2019-12-19 VITALS — BP 154/85 | HR 91 | Temp 96.8°F | Resp 17 | Ht <= 58 in | Wt 147.0 lb

## 2019-12-19 DIAGNOSIS — D128 Benign neoplasm of rectum: Secondary | ICD-10-CM

## 2019-12-19 DIAGNOSIS — K222 Esophageal obstruction: Secondary | ICD-10-CM

## 2019-12-19 DIAGNOSIS — K449 Diaphragmatic hernia without obstruction or gangrene: Secondary | ICD-10-CM | POA: Diagnosis not present

## 2019-12-19 DIAGNOSIS — R195 Other fecal abnormalities: Secondary | ICD-10-CM | POA: Diagnosis not present

## 2019-12-19 DIAGNOSIS — D124 Benign neoplasm of descending colon: Secondary | ICD-10-CM

## 2019-12-19 DIAGNOSIS — K219 Gastro-esophageal reflux disease without esophagitis: Secondary | ICD-10-CM

## 2019-12-19 DIAGNOSIS — K635 Polyp of colon: Secondary | ICD-10-CM | POA: Diagnosis not present

## 2019-12-19 DIAGNOSIS — R1310 Dysphagia, unspecified: Secondary | ICD-10-CM | POA: Diagnosis not present

## 2019-12-19 DIAGNOSIS — K621 Rectal polyp: Secondary | ICD-10-CM | POA: Diagnosis not present

## 2019-12-19 MED ORDER — SODIUM CHLORIDE 0.9 % IV SOLN: 500.0000 mL | INTRAVENOUS | Status: DC

## 2019-12-19 NOTE — Patient Instructions (Signed)
Please read handouts provided. Follow instructions for Dilation Diet. Continue present medications. Await pathology results. Antireflux measures long term. Follow-up with PCP.       YOU HAD AN ENDOSCOPIC PROCEDURE TODAY AT [facility]:   Refer to the procedure report that was given to you for any specific questions about what was found during the examination.  If the procedure report does not answer your questions, please call your gastroenterologist to clarify.  If you requested that your care partner not be given the details of your procedure findings, then the procedure report has been included in a sealed envelope for you to review at your convenience later.  YOU SHOULD EXPECT: Some feelings of bloating in the abdomen. Passage of more gas than usual.  Walking can help get rid of the air that was put into your GI tract during the procedure and reduce the bloating. If you had a lower endoscopy (such as a colonoscopy or flexible sigmoidoscopy) you may notice spotting of blood in your stool or on the toilet paper. If you underwent a bowel prep for your procedure, you may not have a normal bowel movement for a few days.  Please Note:  You might notice some irritation and congestion in your nose or some drainage.  This is from the oxygen used during your procedure.  There is no need for concern and it should clear up in a day or so.  SYMPTOMS TO REPORT IMMEDIATELY:   Following lower endoscopy (colonoscopy or flexible sigmoidoscopy):  Excessive amounts of blood in the stool  Significant tenderness or worsening of abdominal pains  Swelling of the abdomen that is new, acute  Fever of 100F or higher   Following upper endoscopy (EGD)  Vomiting of blood or coffee ground material  New chest pain or pain under the shoulder blades  Painful or persistently difficult swallowing  New shortness of breath  Fever of 100F or higher  Black, tarry-looking stools  For urgent or  emergent issues, a gastroenterologist can be reached at any hour by calling [phone]. Do not use MyChart messaging for urgent concerns.    DIET:   Drink plenty of fluids but you should avoid alcoholic beverages for 24 hours.  ACTIVITY:  You should plan to take it easy for the rest of today and you should NOT DRIVE or use heavy machinery until tomorrow (because of the sedation medicines used during the test).    FOLLOW UP: Our staff will call the number listed on your records 48-72 hours following your procedure to check on you and address any questions or concerns that you may have regarding the information given to you following your procedure. If we do not reach you, we will leave a message.  We will attempt to reach you two times.  During this call, we will ask if you have developed any symptoms of COVID 19. If you develop any symptoms (ie: fever, flu-like symptoms, shortness of breath, cough etc.) before then, please call [phone].  If you test positive for Covid 19 in the 2 weeks post procedure, please call and report this information to [place].    If any biopsies were taken you will be contacted by phone or by letter within the next 1-3 weeks.  Please call us at [phone] if you have not heard about the biopsies in 3 weeks.    SIGNATURES/CONFIDENTIALITY: You and/or your care partner have signed paperwork which will be entered into your electronic medical record.  These  signatures attest to the fact that that the information above on your After Visit Summary has been reviewed and is understood.  Full responsibility of the confidentiality of this discharge information lies with you and/or your care-partner. 

## 2019-12-19 NOTE — Op Note (Signed)
Candace Stewart Patient Name: Candace Stewart Procedure Date: 12/19/2019 1:25 PM MRN: [id number] Endoscopist: Ladene Artist , MD Age: 79 Referring MD:  Date of Birth: 07/03/1941 Gender: Female Account #: [id number] Procedure:                Colonoscopy Indications:              Positive Cologuard test. Personal history of                            adenomatous colon polyps. Medicines:                Monitored Anesthesia Care Procedure:                Pre-Anesthesia Assessment:                           - Prior to the procedure, a History and Physical                            was performed, and patient medications and                            allergies were reviewed. The patient's tolerance of                            previous anesthesia was also reviewed. The risks                            and benefits of the procedure and the sedation                            options and risks were discussed with the patient.                            All questions were answered, and informed consent                            was obtained. Prior Anticoagulants: The patient has                            taken no previous anticoagulant or antiplatelet                            agents. ASA Grade Assessment: III - A patient with                            severe systemic disease. After reviewing the risks                            and benefits, the patient was deemed in                            satisfactory condition to undergo the procedure.  After obtaining informed consent, the colonoscope                            was passed under direct vision. Throughout the                            procedure, the patient's blood pressure, pulse, and                            oxygen saturations were monitored continuously. The                            Colonoscope was introduced through the anus and                            advanced to the the cecum, identified  by                            appendiceal orifice and ileocecal valve. The                            ileocecal valve, appendiceal orifice, and rectum                            were photographed. The quality of the bowel                            preparation was good. The patient tolerated the                            procedure well. The colonoscopy was somewhat                            difficult due to significant looping and a tortuous                            colon. Scope In: 1:40:11 PM Scope Out: 1:59:19 PM Scope Withdrawal Time: 0 hours 12 minutes 37 seconds  Total Procedure Duration: 0 hours 19 minutes 8 seconds  Findings:                 The perianal and digital rectal examinations were                            normal.                           Two sessile polyps were found in the rectum and                            descending colon. The polyps were 6 to 7 mm in                            size. These polyps were removed with a cold snare.  Resection and retrieval were complete.                           The exam was otherwise without abnormality on                            direct and retroflexion views. Complications:            No immediate complications. Estimated blood loss:                            None. Estimated Blood Loss:     Estimated blood loss: none. Impression:               - Two 6 to 7 mm polyps in the rectum and in the                            descending colon, removed with a cold snare.                            Resected and retrieved.                           - The examination was otherwise normal on direct                            and retroflexion views. Recommendation:           - Patient has a contact number available for                            emergencies. The signs and symptoms of potential                            delayed complications were discussed with the                            patient. Return  to normal activities tomorrow.                            Written discharge instructions were provided to the                            patient.                           - Resume previous diet.                           - Continue present medications.                           - Await pathology results.                           - No repeat colonoscopy or Cologuard due to age. Ladene Artist, MD 12/19/2019 2:09:52 PM This report has been  signed electronically.

## 2019-12-19 NOTE — Telephone Encounter (Signed)
Chills and Temp 99.1 F after EGD/savary dilation and colonoscopy + polypectomy No pain, bleeding, nausea or vomiting Has tolerated food w/o problems   Advised observe for now and call back or go to ED prn

## 2019-12-19 NOTE — Progress Notes (Signed)
A and O x3. Report to RN. Tolerated MAC anesthesia well.Teeth unchanged after procedure.

## 2019-12-19 NOTE — Op Note (Signed)
[place] Patient Name: Candace Stewart Procedure Date: 12/19/2019 1:24 PM MRN: [id number] Endoscopist: Ladene Artist , MD Age: 79 Referring MD:  Date of Birth: Jan 31, 1941 Gender: Female Account #: [id number] Procedure:                Upper GI endoscopy Indications:              Dysphagia Medicines:                Propofol per Anesthesia Procedure:                Pre-Anesthesia Assessment:                           - Prior to the procedure, a History and Physical                            was performed, and patient medications and                            allergies were reviewed. The patient's tolerance of                            previous anesthesia was also reviewed. The risks                            and benefits of the procedure and the sedation                            options and risks were discussed with the patient.                            All questions were answered, and informed consent                            was obtained. Prior Anticoagulants: The patient has                            taken no previous anticoagulant or antiplatelet                            agents. ASA Grade Assessment: III - A patient with                            severe systemic disease. After reviewing the risks                            and benefits, the patient was deemed in                            satisfactory condition to undergo the procedure.                           After obtaining informed consent, the endoscope was  passed under direct vision. Throughout the                            procedure, the patient's blood pressure, pulse, and                            oxygen saturations were monitored continuously. The                            Endoscope was introduced through the mouth, and                            advanced to the second part of duodenum. The upper                            GI endoscopy was accomplished without  difficulty.                            The patient tolerated the procedure well. Scope In: Scope Out: Findings:                 The lumen of the proximal esophagus and mid                            esophagus was mildly dilated.                           One benign-appearing, intrinsic mild stenosis was                            found 35 cm from the incisors. This stenosis                            measured 1.3 cm (inner diameter) x less than one cm                            (in length). The stenosis was traversed. A                            guidewire was placed and the scope was withdrawn.                            Dilations were performed with Savary dilators with                            mild resistance at 14 mm, 15 mm and 16 mm.                           The exam of the esophagus was otherwise normal.                           A medium-sized hiatal hernia was present.  The exam of the stomach was otherwise normal.                           The duodenal bulb and second portion of the                            duodenum were normal. Complications:            No immediate complications. Estimated Blood Loss:     Estimated blood loss: none. Impression:               - Dilation in the proximal esophagus and in the mid                            esophagus.                           - Benign-appearing esophageal stenosis. Dilated.                           - Medium-sized hiatal hernia.                           - Normal duodenal bulb and second portion of the                            duodenum.                           - No specimens collected. Recommendation:           - Patient has a contact number available for                            emergencies. The signs and symptoms of potential                            delayed complications were discussed with the                            patient. Return to normal activities tomorrow.                             Written discharge instructions were provided to the                            patient.                           - Clear liquid diet for 2 hours, then advance as                            tolerated to soft diet today.                           - Resume prior diet tomorrow.                           -  Antireflux measures long term.                           - Continue present medications.                           - Follow up with PCP. GI follow up prn. Ladene Artist, MD 12/19/2019 2:16:58 PM This report has been signed electronically.

## 2019-12-21 ENCOUNTER — Telehealth: Payer: Self-pay | Admitting: *Deleted

## 2019-12-21 NOTE — Telephone Encounter (Signed)
  Follow up Call-  Call back number 12/19/2019  Post procedure Call Back phone  # [phone]  Permission to leave phone message Yes  Some recent data might be hidden     Patient questions:  Do you have a fever, pain , or abdominal swelling? No. Pain Score  0 *  Have you tolerated food without any problems? Yes.    Have you been able to return to your normal activities? Yes.    Do you have any questions about your discharge instructions: Diet   No. Medications  No. Follow up visit  No.  Do you have questions or concerns about your Care? No.  Actions: * If pain score is 4 or above: No action needed, pain <4  1. Have you developed a fever since your procedure? NO- stated she a low grade fever of 100.0  the day of procedure but has been fine since.   2.   Have you had an respiratory symptoms (SOB or cough) since your procedure? NO  3.   Have you tested positive for COVID 19 since your procedure NO  4.   Have you had any family members/close contacts diagnosed with the COVID 19 since your procedure?  NO   If yes to any of these questions please route to Joylene John, RN and Erenest Rasher, RN

## 2019-12-27 ENCOUNTER — Encounter: Payer: Self-pay | Admitting: Gastroenterology

## 2020-01-20 ENCOUNTER — Encounter (HOSPITAL_COMMUNITY): Payer: Self-pay

## 2020-01-20 ENCOUNTER — Ambulatory Visit (HOSPITAL_COMMUNITY)
Admission: EM | Admit: 2020-01-20 | Discharge: 2020-01-20 | Disposition: A | Discharge status: Home / Self Care | Payer: Medicare Other | Attending: Internal Medicine | Admitting: Internal Medicine

## 2020-01-20 ENCOUNTER — Other Ambulatory Visit: Payer: Self-pay

## 2020-01-20 DIAGNOSIS — Z882 Allergy status to sulfonamides status: Secondary | ICD-10-CM | POA: Diagnosis not present

## 2020-01-20 DIAGNOSIS — M8580 Other specified disorders of bone density and structure, unspecified site: Secondary | ICD-10-CM | POA: Diagnosis not present

## 2020-01-20 DIAGNOSIS — Z8249 Family history of ischemic heart disease and other diseases of the circulatory system: Secondary | ICD-10-CM | POA: Insufficient documentation

## 2020-01-20 DIAGNOSIS — Z86010 Personal history of colonic polyps: Secondary | ICD-10-CM | POA: Diagnosis not present

## 2020-01-20 DIAGNOSIS — Z87891 Personal history of nicotine dependence: Secondary | ICD-10-CM | POA: Insufficient documentation

## 2020-01-20 DIAGNOSIS — M1990 Unspecified osteoarthritis, unspecified site: Secondary | ICD-10-CM | POA: Insufficient documentation

## 2020-01-20 DIAGNOSIS — Z8719 Personal history of other diseases of the digestive system: Secondary | ICD-10-CM | POA: Diagnosis not present

## 2020-01-20 DIAGNOSIS — J208 Acute bronchitis due to other specified organisms: Secondary | ICD-10-CM | POA: Diagnosis not present

## 2020-01-20 DIAGNOSIS — E7800 Pure hypercholesterolemia, unspecified: Secondary | ICD-10-CM | POA: Insufficient documentation

## 2020-01-20 DIAGNOSIS — F419 Anxiety disorder, unspecified: Secondary | ICD-10-CM | POA: Diagnosis not present

## 2020-01-20 DIAGNOSIS — Z20822 Contact with and (suspected) exposure to covid-19: Secondary | ICD-10-CM | POA: Insufficient documentation

## 2020-01-20 DIAGNOSIS — I10 Essential (primary) hypertension: Secondary | ICD-10-CM | POA: Diagnosis not present

## 2020-01-20 DIAGNOSIS — Z79899 Other long term (current) drug therapy: Secondary | ICD-10-CM | POA: Insufficient documentation

## 2020-01-20 DIAGNOSIS — M810 Age-related osteoporosis without current pathological fracture: Secondary | ICD-10-CM | POA: Diagnosis not present

## 2020-01-20 DIAGNOSIS — Z880 Allergy status to penicillin: Secondary | ICD-10-CM | POA: Insufficient documentation

## 2020-01-20 DIAGNOSIS — Z8262 Family history of osteoporosis: Secondary | ICD-10-CM | POA: Diagnosis not present

## 2020-01-20 DIAGNOSIS — K219 Gastro-esophageal reflux disease without esophagitis: Secondary | ICD-10-CM | POA: Insufficient documentation

## 2020-01-20 HISTORY — DX: COVID-19: U07.1

## 2020-01-20 MED ORDER — BENZONATATE 100 MG PO CAPS
100.0000 mg | ORAL_CAPSULE | Freq: Three times a day (TID) | ORAL | 0 refills | Status: DC
Start: 2020-01-20 — End: 2020-11-15

## 2020-01-20 MED ORDER — MUCINEX 600 MG PO TB12
600.0000 mg | ORAL_TABLET | Freq: Two times a day (BID) | ORAL | 0 refills | Status: DC
Start: 2020-01-20 — End: 2020-11-15

## 2020-01-20 NOTE — ED Triage Notes (Signed)
Pt c/o non productive cough, sore throat, dysphagiax5 days. Pt has non labored breathing. Lungs are clear.

## 2020-01-21 LAB — SARS CORONAVIRUS 2 (TAT 6-24 HRS): SARS Coronavirus 2: NEGATIVE

## 2020-01-21 NOTE — ED Provider Notes (Signed)
[place]    CSN: [id number] Arrival date & time: 01/20/20  1407      History   Chief Complaint Chief Complaint  Patient presents with  . Sore Throat    HPI Candace Stewart is a 79 y.o. female comes to urgent care with a 5-day history of nonproductive cough, sore throat and difficulty swallowing.  Symptoms started insidiously and has been persistent over the past 5 days.  Cough is not productive.  She denies any shortness of breath or wheezing.  Oral intake is good.  No chest pain or chest pressure.  Patient's daughter had similar symptoms but has recovered from it.  Patient has been fully vaccinated against COVID-19 virus.Marland Kitchen   HPI  Past Medical History:  Diagnosis Date  . Allergic rhinitis   . Anxiety   . Arthritis   . COVID-19   . Depression   . Fibrocystic breast disease   . GERD (gastroesophageal reflux disease)    w/ LPR  . Hemorrhoids   . Hiatal hernia    EGD  . Hypercholesteremia   . Hypertension   . Mild depression ([place])   . Osteopenia   . Osteoporosis   . Prolapsed uterus 1973   with TAH  . Tubular adenoma of colon 05/2009    Patient Active Problem List   Diagnosis Date Noted  . Female bladder prolapse 01/20/2017  . GERD 05/14/2009  . DYSPHAGIA 05/14/2009    Past Surgical History:  Procedure Laterality Date  . ABDOMINAL HYSTERECTOMY    . CARPAL TUNNEL RELEASE    . LEFT HEART CATHETERIZATION WITH CORONARY ANGIOGRAM N/A 01/17/2013   Procedure: LEFT HEART CATHETERIZATION WITH CORONARY ANGIOGRAM;  Surgeon: Laverda Page, MD;  Location[facility] C[facility] LAB;  Service: Cardiovascular;  Laterality: N/A;  . pilonidal cyst surgery     age 9  . right arm surgery    . TONSILLECTOMY      OB History   No obstetric history on file.      Home Medications    Prior to Admission medications   Medication Sig Start Date End Date Taking? Authorizing Provider  acetaminophen (TYLENOL) 325 MG tablet Take 650 mg by mouth every 6 (six) hours as needed.     [provider]  benzonatate (TESSALON) 100 MG capsule Take 1 capsule (100 mg total) by mouth every 8 (eight) hours. 01/20/20   Monico Sudduth, Myrene Galas, MD  Calcium Carbonate-Vitamin D3 (CALCIUM 600-D) 600-400 MG-UNIT TABS Take by mouth 2 (two) times daily.    [provider]  esomeprazole (NEXIUM) 40 MG capsule Take 40 mg by mouth daily before breakfast.     [provider]  guaiFENesin (MUCINEX) 600 MG 12 hr tablet Take 1 tablet (600 mg total) by mouth 2 (two) times daily. 01/20/20   Ericka Marcellus, Myrene Galas, MD  LORazepam (ATIVAN) 0.5 MG tablet Take 0.5 mg by mouth every 8 (eight) hours as needed for anxiety.     [provider]  metoprolol succinate (TOPROL-XL) 25 MG 24 hr tablet Take 25 mg by mouth daily. 09/13/19   [provider]  Multiple Vitamin (MULTIVITAMIN WITH MINERALS) TABS Take 1 tablet by mouth daily.    [provider]    Family History Family History  Problem Relation Age of Onset  . Colon polyps Sister        deceased  . Colon cancer Cousin 70  . Colitis Other        Mother's side of family  . Diabetes  Father   . Diabetes Son   . Diabetes Sister        deceased  . Diabetes Brother   . Kidney disease Sister   . Pulmonary Hypertension Sister        deceased  . Stroke Sister        deceased  . Osteoporosis Mother   . Heart failure Mother     Social History Social History   Tobacco Use  . Smoking status: Former Smoker    Types: Cigarettes  . Smokeless tobacco: Never Used  Vaping Use  . Vaping Use: Never used  Substance Use Topics  . Alcohol use: No  . Drug use: No     Allergies   Amoxicillin and Sulfonamide derivatives   Review of Systems Review of Systems  Constitutional: Negative.   HENT: Positive for sore throat. Negative for congestion, dental problem and mouth sores.   Respiratory: Positive for cough. Negative for shortness of breath and wheezing.   Cardiovascular: Negative for chest pain and  palpitations.  Gastrointestinal: Negative.   Skin: Negative.   Neurological: Negative.      Physical Exam Triage Vital Signs ED Triage Vitals [01/20/20 1431]  Enc Vitals Group     BP (!) 139/52     Pulse Rate 96     Resp 16     Temp 98.5 F (36.9 C)     Temp Source Oral     SpO2 97 %     Weight 145 lb (65.8 kg)     Height 4\' 10"  (1.473 m)     Head Circumference      Peak Flow      Pain Score 0     Pain Loc      Pain Edu?      Excl. in [place]?    No data found.  Updated Vital Signs BP (!) 139/52   Pulse 96   Temp 98.5 F (36.9 C) (Oral)   Resp 16   Ht 4\' 10"  (1.473 m)   Wt 65.8 kg   SpO2 97%   BMI 30.31 kg/m   Visual Acuity Right Eye Distance:   Left Eye Distance:   Bilateral Distance:    Right Eye Near:   Left Eye Near:    Bilateral Near:     Physical Exam Vitals and nursing note reviewed.  Constitutional:      General: She is not in acute distress.    Appearance: She is not ill-appearing.  HENT:     Right Ear: Tympanic membrane normal.     Left Ear: Tympanic membrane normal.     Mouth/Throat:     Tonsils: No tonsillar exudate. 0 on the right. 0 on the left.  Eyes:     Extraocular Movements:     Right eye: Normal extraocular motion.     Left eye: Normal extraocular motion.     Conjunctiva/sclera: Conjunctivae normal.  Cardiovascular:     Rate and Rhythm: Normal rate and regular rhythm.     Heart sounds: Normal heart sounds. No murmur heard.  No friction rub.  Pulmonary:     Effort: Pulmonary effort is normal. No respiratory distress.     Breath sounds: No rhonchi or rales.  Abdominal:     General: There is no distension.     Palpations: Abdomen is soft.     Tenderness: There is no abdominal tenderness. There is no guarding.  Neurological:     Mental Status: She is alert.  UC Treatments / Results  Labs (all labs ordered are listed, but only abnormal results are displayed) Labs Reviewed  SARS CORONAVIRUS 2 (TAT 6-24 HRS)     EKG   Radiology No results found.  Procedures Procedures (including critical care time)  Medications Ordered in UC Medications - No data to display  Initial Impression / Assessment and Plan / UC Course  I have reviewed the triage vital signs and the nursing notes.  Pertinent labs & imaging results that were available during my care of the patient were reviewed by me and considered in my medical decision making (see chart for details).     1.  Acute bronchitis: Tessalon Perles as needed for cough Mucinex as needed Patient chest is clear.  No indication for chest x-ray COVID-19 PCR has been sent Patient is advised to quarantine until COVID-19 test results are available. Return precautions given. Final Clinical Impressions(s) / UC Diagnoses   Final diagnoses:  Acute bronchitis due to other specified organisms   Discharge Instructions   None    ED Prescriptions    Medication Sig Dispense Auth. Provider   benzonatate (TESSALON) 100 MG capsule Take 1 capsule (100 mg total) by mouth every 8 (eight) hours. 30 capsule Eyana Stolze, Myrene Galas, MD   guaiFENesin (MUCINEX) 600 MG 12 hr tablet Take 1 tablet (600 mg total) by mouth 2 (two) times daily. 30 tablet Oda Lansdowne, Myrene Galas, MD     PDMP not reviewed this encounter.   Chase Picket, MD 01/21/20 1216

## 2020-05-17 ENCOUNTER — Other Ambulatory Visit: Payer: Medicare Other

## 2020-05-17 DIAGNOSIS — Z20822 Contact with and (suspected) exposure to covid-19: Secondary | ICD-10-CM

## 2020-05-18 LAB — SARS-COV-2, NAA 2 DAY TAT

## 2020-05-18 LAB — NOVEL CORONAVIRUS, NAA: SARS-CoV-2, NAA: NOT DETECTED

## 2020-11-15 ENCOUNTER — Ambulatory Visit (INDEPENDENT_AMBULATORY_CARE_PROVIDER_SITE_OTHER): Payer: Medicare Other | Admitting: Podiatry

## 2020-11-15 ENCOUNTER — Ambulatory Visit (INDEPENDENT_AMBULATORY_CARE_PROVIDER_SITE_OTHER): Payer: Medicare Other

## 2020-11-15 ENCOUNTER — Encounter: Payer: Self-pay | Admitting: Podiatry

## 2020-11-15 ENCOUNTER — Other Ambulatory Visit: Payer: Self-pay

## 2020-11-15 DIAGNOSIS — J189 Pneumonia, unspecified organism: Secondary | ICD-10-CM | POA: Insufficient documentation

## 2020-11-15 DIAGNOSIS — Z7189 Other specified counseling: Secondary | ICD-10-CM | POA: Insufficient documentation

## 2020-11-15 DIAGNOSIS — E7800 Pure hypercholesterolemia, unspecified: Secondary | ICD-10-CM | POA: Insufficient documentation

## 2020-11-15 DIAGNOSIS — F419 Anxiety disorder, unspecified: Secondary | ICD-10-CM | POA: Insufficient documentation

## 2020-11-15 DIAGNOSIS — M722 Plantar fascial fibromatosis: Secondary | ICD-10-CM

## 2020-11-15 DIAGNOSIS — I10 Essential (primary) hypertension: Secondary | ICD-10-CM | POA: Insufficient documentation

## 2020-11-15 DIAGNOSIS — E785 Hyperlipidemia, unspecified: Secondary | ICD-10-CM | POA: Insufficient documentation

## 2020-11-15 DIAGNOSIS — J069 Acute upper respiratory infection, unspecified: Secondary | ICD-10-CM | POA: Insufficient documentation

## 2020-11-15 DIAGNOSIS — Z780 Asymptomatic menopausal state: Secondary | ICD-10-CM | POA: Insufficient documentation

## 2020-11-15 DIAGNOSIS — M8580 Other specified disorders of bone density and structure, unspecified site: Secondary | ICD-10-CM | POA: Insufficient documentation

## 2020-11-15 DIAGNOSIS — R079 Chest pain, unspecified: Secondary | ICD-10-CM | POA: Insufficient documentation

## 2020-11-15 DIAGNOSIS — R509 Fever, unspecified: Secondary | ICD-10-CM | POA: Insufficient documentation

## 2020-11-15 DIAGNOSIS — R059 Cough, unspecified: Secondary | ICD-10-CM | POA: Insufficient documentation

## 2020-11-15 MED ORDER — METHYLPREDNISOLONE 4 MG PO TBPK
ORAL_TABLET | ORAL | 0 refills | Status: DC
Start: 2020-11-15 — End: 2022-01-20

## 2020-11-15 MED ORDER — MELOXICAM 15 MG PO TABS
15.0000 mg | ORAL_TABLET | Freq: Every day | ORAL | 3 refills | Status: DC
Start: 2020-11-15 — End: 2021-03-11

## 2020-11-15 MED ORDER — TRIAMCINOLONE ACETONIDE 40 MG/ML IJ SUSP
20.0000 mg | Freq: Once | INTRAMUSCULAR | Status: AC
  Administered 2020-11-15: 20 mg

## 2020-11-15 NOTE — Patient Instructions (Signed)

## 2020-11-17 NOTE — Progress Notes (Signed)
Subjective:  Patient ID: Candace Stewart, female    DOB: Jan 22, 1941,  MRN: [id number] HPI Chief Complaint  Patient presents with  . Foot Pain    Plantar heel left - aching x few weeks, AM pain, Tylenol PRN, treated right heel last visit-unsure if she has the DME we dispensed before  . New Patient (Initial Visit)    Est pt 2018    80 y.o. female presents with the above complaint.   ROS: Denies fever chills nausea vomiting muscle aches pains calf pain back pain chest pain shortness of breath.  Past Medical History:  Diagnosis Date  . Allergic rhinitis   . Anxiety   . Arthritis   . COVID-19   . Depression   . Fibrocystic breast disease   . GERD (gastroesophageal reflux disease)    w/ LPR  . Hemorrhoids   . Hiatal hernia    EGD  . Hypercholesteremia   . Hypertension   . Mild depression ([place])   . Osteopenia   . Osteoporosis   . Prolapsed uterus 1973   with TAH  . Tubular adenoma of colon 05/2009   Past Surgical History:  Procedure Laterality Date  . ABDOMINAL HYSTERECTOMY    . CARPAL TUNNEL RELEASE    . LEFT HEART CATHETERIZATION WITH CORONARY ANGIOGRAM N/A 01/17/2013   Procedure: LEFT HEART CATHETERIZATION WITH CORONARY ANGIOGRAM;  Surgeon: Laverda Page, MD;  Location: [facility] CATH LAB;  Service: Cardiovascular;  Laterality: N/A;  . pilonidal cyst surgery     age 40  . right arm surgery    . TONSILLECTOMY      Current Outpatient Medications:  .  meloxicam (MOBIC) 15 MG tablet, Take 1 tablet (15 mg total) by mouth daily., Disp: 30 tablet, Rfl: 3 .  methylPREDNISolone (MEDROL DOSEPAK) 4 MG TBPK tablet, 6 day dose pack - take as directed, Disp: 21 tablet, Rfl: 0 .  acetaminophen (TYLENOL) 325 MG tablet, Take 650 mg by mouth every 6 (six) hours as needed., Disp: , Rfl:  .  Calcium Carbonate-Vitamin D3 (CALCIUM 600-D) 600-400 MG-UNIT TABS, Take by mouth 2 (two) times daily., Disp: , Rfl:  .  esomeprazole (NEXIUM) 40 MG capsule, Take 40 mg by mouth daily before breakfast. ,  Disp: , Rfl:  .  LORazepam (ATIVAN) 0.5 MG tablet, Take 0.5 mg by mouth every 8 (eight) hours as needed for anxiety. , Disp: , Rfl:  .  metoprolol succinate (TOPROL-XL) 25 MG 24 hr tablet, Take 25 mg by mouth daily., Disp: , Rfl:  .  Multiple Vitamin (MULTIVITAMIN WITH MINERALS) TABS, Take 1 tablet by mouth daily., Disp: , Rfl:   Allergies  Allergen Reactions  . Amoxicillin Diarrhea  . Sulfonamide Derivatives     unknown   Review of Systems Objective:  There were no vitals filed for this visit.  General: Well developed, nourished, in no acute distress, alert and oriented x3   Dermatological: Skin is warm, dry and supple bilateral. Nails x 10 are well maintained; remaining integument appears unremarkable at this time. There are no open sores, no preulcerative lesions, no rash or signs of infection present.  Vascular: Dorsalis Pedis artery and Posterior Tibial artery pedal pulses are 2/4 bilateral with immedate capillary fill time. Pedal hair growth present. No varicosities and no lower extremity edema present bilateral.   Neruologic: Grossly intact via light touch bilateral. Vibratory intact via tuning fork bilateral. Protective threshold with Semmes Wienstein monofilament intact to all pedal sites bilateral. Patellar and Achilles deep tendon reflexes  2+ bilateral. No Babinski or clonus noted bilateral.   Musculoskeletal: No gross boney pedal deformities bilateral. No pain, crepitus, or limitation noted with foot and ankle range of motion bilateral. Muscular strength 5/5 in all groups tested bilateral.  Pain on palpation medial calcaneal tubercle of the left heel no pain on medial lateral compression of the calcaneus.  Gait: Unassisted, Nonantalgic.    Radiographs:  Radiographs taken today demonstrate small plantar distal aorticocaval heel spur soft tissue increase in density plantar skin insertion site mild osteopenia noted no acute findings are noted.  Assessment & Plan:    Assessment: Plantar fasciitis left.  Plan: Discussed etiology pathology conservative versus surgical therapies.  At this point I injected the left heel to the medial aspect of the plantar fascia at its insertion site milligrams Kenalog 5 mg Marcaine.  She tolerated procedure well after Betadine skin prep.  Start her on methylprednisolone to be followed by meloxicam.  Placed her in plantar fascial brace discussed appropriate shoe gear stretching exercises ice therapy and shoe gear modifications I would like to follow-up with her in 1 month.     Elley Harp T. [place], [place]

## 2020-12-15 ENCOUNTER — Ambulatory Visit
Admission: EM | Admit: 2020-12-15 | Discharge: 2020-12-15 | Disposition: A | Discharge status: Home / Self Care | Payer: Medicare Other | Attending: Family Medicine | Admitting: Family Medicine

## 2020-12-15 ENCOUNTER — Encounter: Payer: Self-pay | Admitting: *Deleted

## 2020-12-15 ENCOUNTER — Other Ambulatory Visit: Payer: Self-pay

## 2020-12-15 DIAGNOSIS — L03114 Cellulitis of left upper limb: Secondary | ICD-10-CM | POA: Diagnosis not present

## 2020-12-15 DIAGNOSIS — I10 Essential (primary) hypertension: Secondary | ICD-10-CM

## 2020-12-15 DIAGNOSIS — W57XXXA Bitten or stung by nonvenomous insect and other nonvenomous arthropods, initial encounter: Secondary | ICD-10-CM

## 2020-12-15 MED ORDER — DOXYCYCLINE HYCLATE 100 MG PO CAPS
100.0000 mg | ORAL_CAPSULE | Freq: Two times a day (BID) | ORAL | 0 refills | Status: DC
Start: 2020-12-15 — End: 2022-01-20

## 2020-12-15 MED ORDER — TRIAMCINOLONE ACETONIDE 0.1 % EX CREA
1.0000 | TOPICAL_CREAM | Freq: Two times a day (BID) | CUTANEOUS | 0 refills | Status: DC
Start: 2020-12-15 — End: 2022-06-25

## 2020-12-15 NOTE — Discharge Instructions (Addendum)
Your blood pressure was noted to be elevated during your visit today. If you are currently taking medication for high blood pressure, please ensure you are taking this as directed. If you do not have a history of high blood pressure and your blood pressure remains persistently elevated, you may need to begin taking a medication at some point. You may return here within the next few days to recheck if unable to see your primary care provider or if you do not have a one.  BP (S) (!) 174/81 Comment: has not taken HTN med today  Pulse 62   Temp 98 F (36.7 C) (Oral)   Resp 18   SpO2 95%   BP Readings from Last 3 Encounters:  12/15/20 (S) (!) 174/81  01/20/20 (!) 139/52  12/19/19 (!) 154/85

## 2020-12-15 NOTE — ED Triage Notes (Signed)
Pt reports picking strawberries 4 days ago; the next morning noticed what she believes to be an insect bite to left upper arm.  Since then, lesion has surrounding erythema with progressive worsening and pruritis.

## 2020-12-17 NOTE — ED Provider Notes (Addendum)
[place]   [id number] 12/15/20 Arrival Time: 2778  ASSESSMENT & PLAN:  1. Cellulitis of left upper extremity   2. Elevated blood pressure reading with diagnosis of hypertension   3. Insect bite of left upper arm, initial encounter    Begin: Meds ordered this encounter  Medications  . doxycycline (VIBRAMYCIN) 100 MG capsule    Sig: Take 1 capsule (100 mg total) by mouth 2 (two) times daily.    Dispense:  14 capsule    Refill:  0  . triamcinolone cream (KENALOG) 0.1 %    Sig: Apply 1 application topically 2 (two) times daily.    Dispense:  30 g    Refill:  0   Will cover for infection. Monitor area closely.    Discharge Instructions      Your blood pressure was noted to be elevated during your visit today. If you are currently taking medication for high blood pressure, please ensure you are taking this as directed. If you do not have a history of high blood pressure and your blood pressure remains persistently elevated, you may need to begin taking a medication at some point. You may return here within the next few days to recheck if unable to see your primary care provider or if you do not have a one.  BP (S) (!) 174/81 Comment: has not taken HTN med today  Pulse 62   Temp 98 F (36.7 C) (Oral)   Resp 18   SpO2 95%   BP Readings from Last 3 Encounters:  12/15/20 (S) (!) 174/81  01/20/20 (!) 139/52  12/19/19 (!) 154/85       Will follow up with PCP or here if worsening or failing to improve as anticipated. Reviewed expectations re: course of current medical issues. Questions answered. Outlined signs and symptoms indicating need for more acute intervention. Patient verbalized understanding. After Visit Summary given.   SUBJECTIVE:  Candace Stewart is a 80 y.o. female who presents with a skin complaint. Ques insect bite of L elbow; x 3-4 d; increase in erythema and warmth. No fever reported. Mild itching. No extremity sensation changes or weakness.    Increased blood pressure noted today. Reports that she is treated for HTN. She reports taking medications as instructed, no chest pain on exertion, no dyspnea on exertion, no swelling of ankles, no orthostatic dizziness or lightheadedness, no orthopnea or paroxysmal nocturnal dyspnea and no palpitations   .\ OBJECTIVE: Vitals:   12/15/20 0918  BP: (S) (!) 174/81  Pulse: 62  Resp: 18  Temp: 98 F (36.7 C)  TempSrc: Oral  SpO2: 95%    General appearance: alert; no distress HEENT: [place]; AT Neck: supple with FROM Lungs: unlabored Extremities: no edema; moves all extremities normally Skin: warm and dry; approx 3x4 area of erythema around L elbow; hot to touch; no areas of fluctuance Psychological: alert and cooperative; normal mood and affect  Allergies  Allergen Reactions  . Amoxicillin Diarrhea  . Sulfonamide Derivatives     unknown    Past Medical History:  Diagnosis Date  . Allergic rhinitis   . Anxiety   . Arthritis   . COVID-19   . Depression   . Fibrocystic breast disease   . GERD (gastroesophageal reflux disease)    w/ LPR  . Hemorrhoids   . Hiatal hernia    EGD  . Hypercholesteremia   . Hypertension   . Mild depression ([place])   . Osteopenia   . Osteoporosis   .  Prolapsed uterus 1973   with TAH  . Tubular adenoma of colon 05/2009   Social History   Socioeconomic History  . Marital status: Married    Spouse name: Not on file  . Number of children: Not on file  . Years of education: Not on file  . Highest education level: Not on file  Occupational History  . Not on file  Tobacco Use  . Smoking status: Former Smoker    Types: Cigarettes  . Smokeless tobacco: Never Used  Vaping Use  . Vaping Use: Never used  Substance and Sexual Activity  . Alcohol use: No  . Drug use: No  . Sexual activity: Not on file  Other Topics Concern  . Not on file  Social History Narrative  . Not on file   Social Determinants of Health   Financial Resource  Strain: Not on file  Food Insecurity: Not on file  Transportation Needs: Not on file  Physical Activity: Not on file  Stress: Not on file  Social Connections: Not on file  Intimate Partner Violence: Not on file   Family History  Problem Relation Age of Onset  . Colon polyps Sister        deceased  . Colon cancer Cousin 47  . Colitis Other        Mother's side of family  . Diabetes Father   . Diabetes Son   . Diabetes Sister        deceased  . Diabetes Brother   . Kidney disease Sister   . Pulmonary Hypertension Sister        deceased  . Stroke Sister        deceased  . Osteoporosis Mother   . Heart failure Mother    Past Surgical History:  Procedure Laterality Date  . ABDOMINAL HYSTERECTOMY    . CARPAL TUNNEL RELEASE    . LEFT HEART CATHETERIZATION WITH CORONARY ANGIOGRAM N/A 01/17/2013   Procedure: LEFT HEART CATHETERIZATION WITH CORONARY ANGIOGRAM;  Surgeon: Laverda Page, MD;  Location: [facility] CATH LAB;  Service: Cardiovascular;  Laterality: N/A;  . pilonidal cyst surgery     age 79  . right arm surgery    . Evonnie Dawes, MD 12/17/20 2197    Vanessa Kick, MD 12/17/20 [phone]

## 2020-12-18 ENCOUNTER — Ambulatory Visit: Payer: Medicare Other | Admitting: Podiatry

## 2021-01-08 IMAGING — MG DIGITAL SCREENING BILAT W/ TOMO W/ CAD
8 series · 8 of 24 positions shown · non-contrast
Comparison: Previous exam(s).

CLINICAL DATA: Screening.

EXAM:
DIGITAL SCREENING BILATERAL MAMMOGRAM WITH TOMO AND CAD

[R MLO synth-2D]
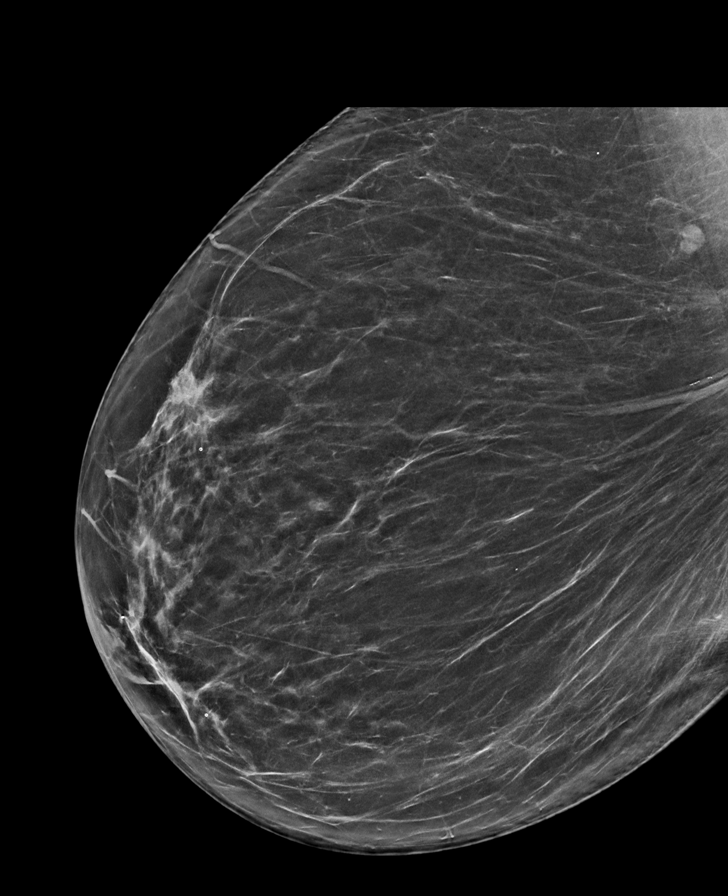

[R CC synth-2D]
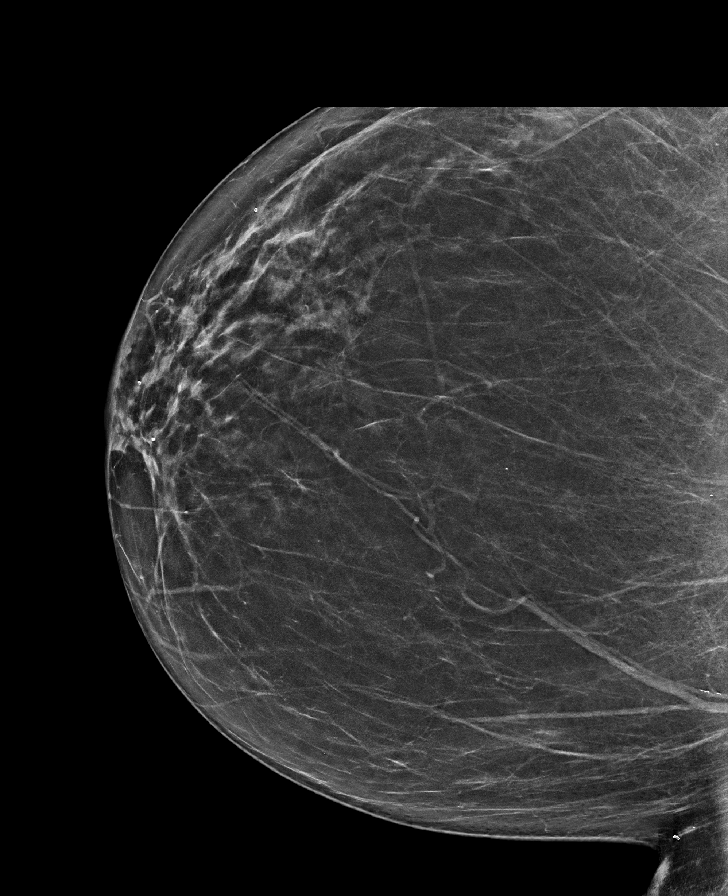

[L MLO synth-2D]
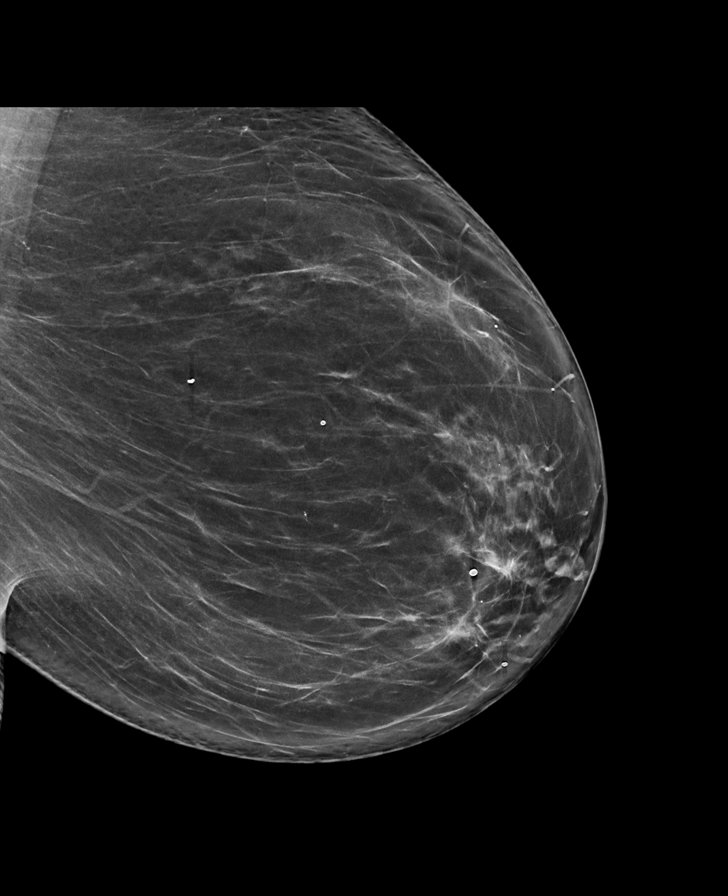

[L CC synth-2D]
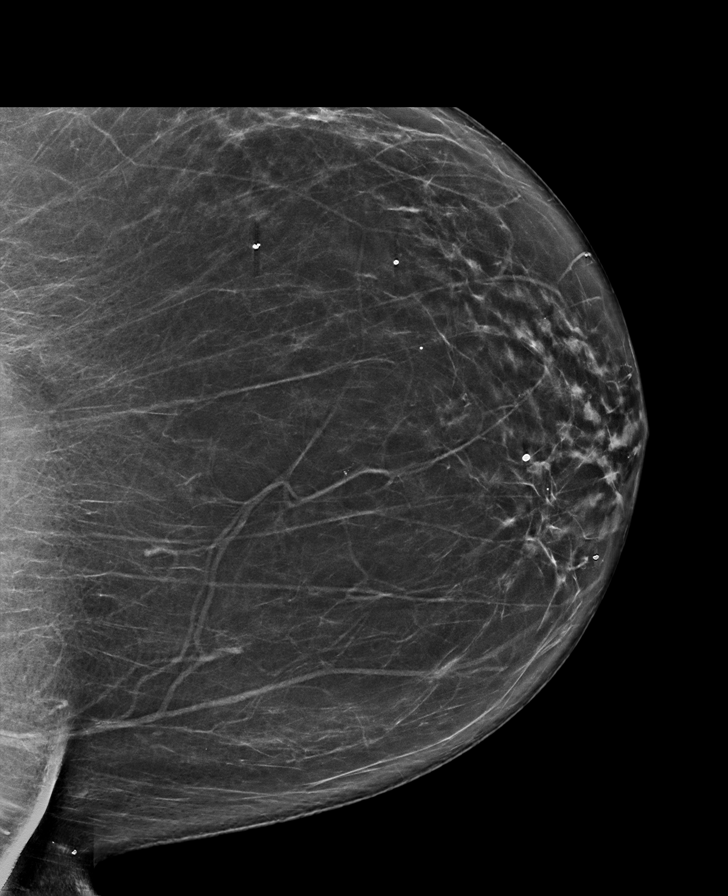

[R MLO tomo · tomo slice 41/81.0]
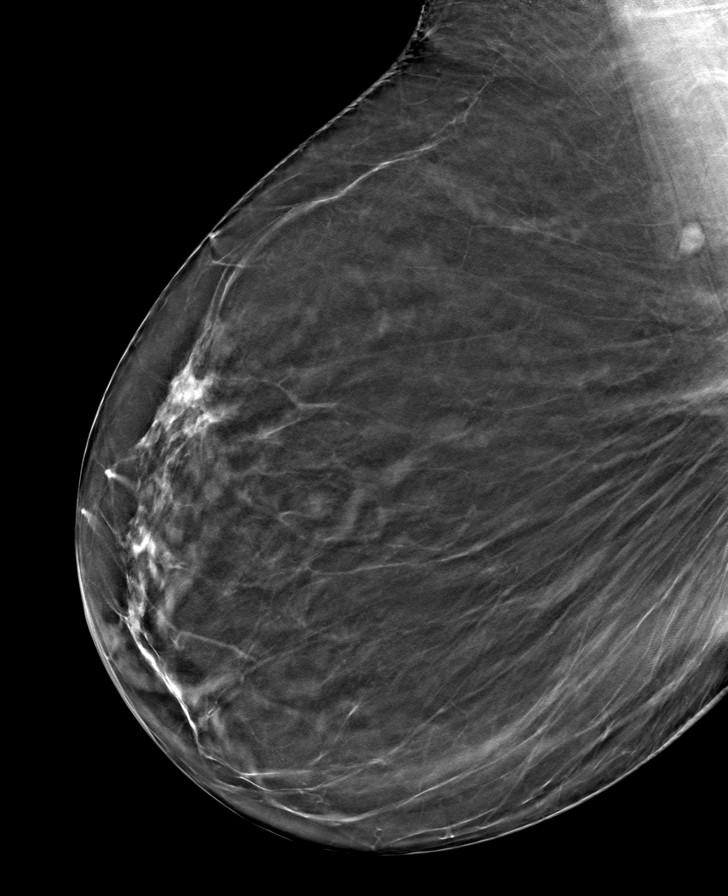

[L CC tomo · tomo slice 41/80.0]
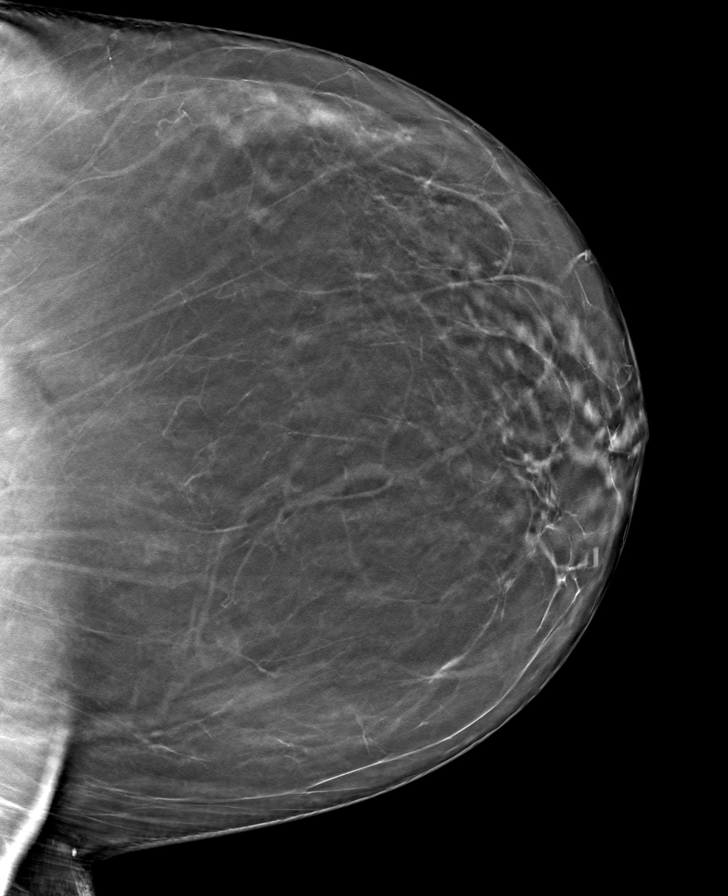

[R CC tomo · tomo slice 38/75.0]
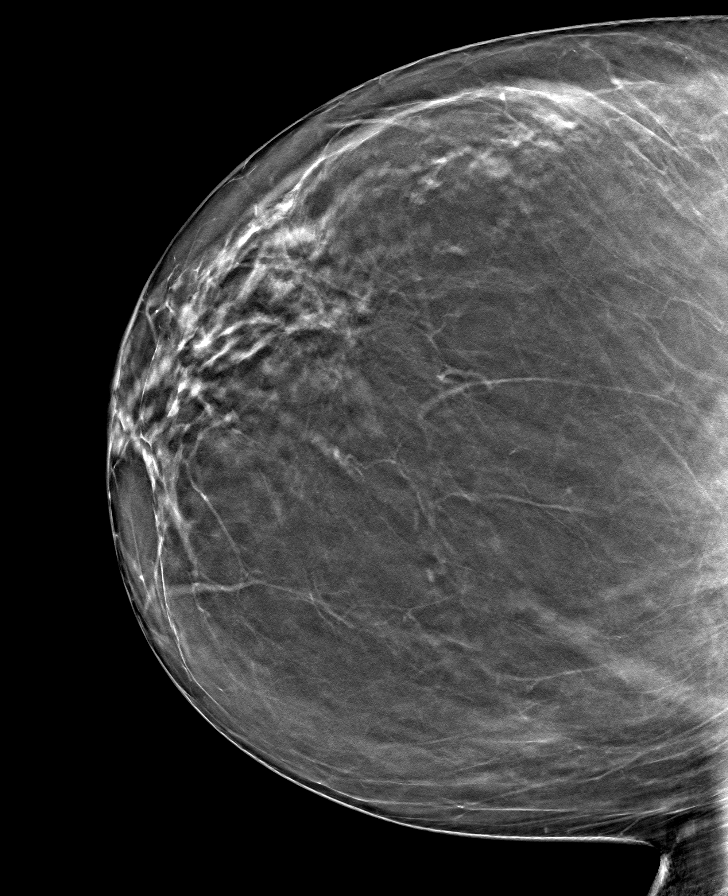

[L MLO tomo · tomo slice 45/88.0]
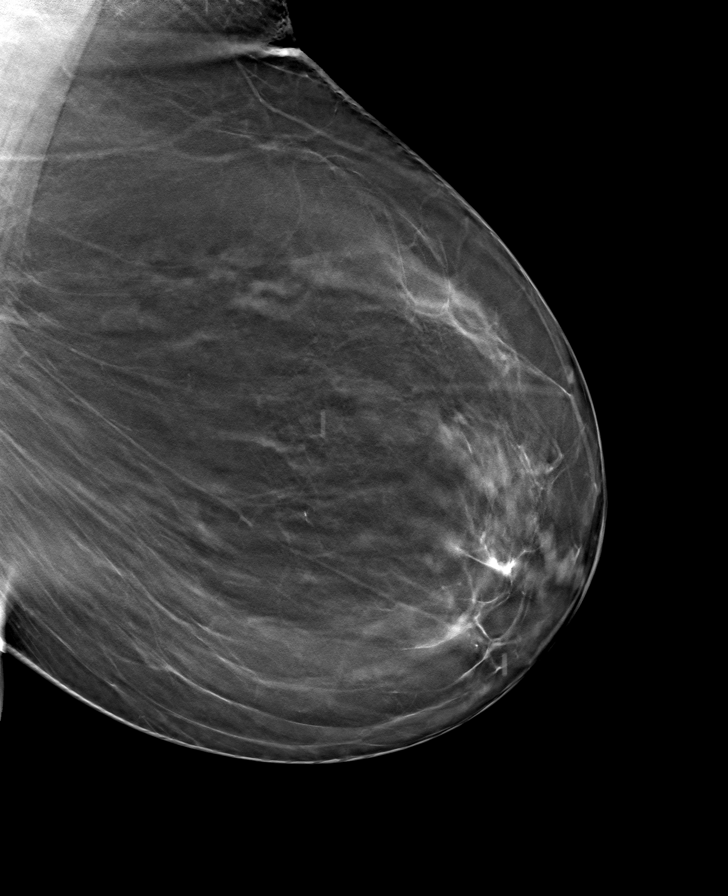

[8 of 24 positions shown; findings below may reference images not displayed]

ACR Breast Density Category b: There are scattered areas of
fibroglandular density.
FINDINGS: There are no findings suspicious for malignancy. Images were
processed with CAD.
IMPRESSION: No mammographic evidence of malignancy. A result letter of this
screening mammogram will be mailed directly to the patient.

RECOMMENDATION:
Screening mammogram in one year. (Code:CN-U-775)

BI-RADS CATEGORY  1: Negative.

## 2021-01-24 ENCOUNTER — Other Ambulatory Visit: Payer: Self-pay | Admitting: Registered Nurse

## 2021-01-24 ENCOUNTER — Other Ambulatory Visit: Payer: Self-pay

## 2021-01-24 ENCOUNTER — Telehealth: Payer: Self-pay

## 2021-01-24 DIAGNOSIS — Z1231 Encounter for screening mammogram for malignant neoplasm of breast: Secondary | ICD-10-CM

## 2021-01-24 NOTE — Telephone Encounter (Signed)
Patient called and stated that she has been having some SOB, but not while she is resting. It happens when she is going up and down the steps in her home. No complaints of CP, but has  noticed some weight gain. Patient stated the symptoms have worsened in the past couple of months and she has just kept putting it off. Would you like for her a to schedule an appointment? STAT or next available. Please advise.

## 2021-01-24 NOTE — Telephone Encounter (Signed)
Symptoms ongoing for 2 months. Can see [place] electively. Can see Candace Stewart and me together anytime

## 2021-01-31 NOTE — Telephone Encounter (Signed)
Called patient, someone picked and then hung up. No message was left.

## 2021-03-11 ENCOUNTER — Other Ambulatory Visit: Payer: Self-pay | Admitting: Podiatry

## 2021-03-11 NOTE — Telephone Encounter (Signed)
Please advise 

## 2021-03-20 ENCOUNTER — Ambulatory Visit
Admission: RE | Admit: 2021-03-20 | Discharge: 2021-03-20 | Disposition: A | Discharge status: Home / Self Care | Payer: Medicare Other | Source: Ambulatory Visit | Attending: Registered Nurse | Admitting: Registered Nurse

## 2021-03-20 ENCOUNTER — Other Ambulatory Visit: Payer: Self-pay

## 2021-03-20 DIAGNOSIS — Z1231 Encounter for screening mammogram for malignant neoplasm of breast: Secondary | ICD-10-CM

## 2021-08-05 ENCOUNTER — Other Ambulatory Visit: Payer: Self-pay | Admitting: Podiatry

## 2021-08-06 NOTE — Telephone Encounter (Signed)
Please advise 

## 2022-01-20 ENCOUNTER — Encounter (HOSPITAL_COMMUNITY): Payer: Self-pay

## 2022-01-20 ENCOUNTER — Ambulatory Visit (INDEPENDENT_AMBULATORY_CARE_PROVIDER_SITE_OTHER): Payer: Medicare Other

## 2022-01-20 ENCOUNTER — Ambulatory Visit (HOSPITAL_COMMUNITY)
Admission: RE | Admit: 2022-01-20 | Discharge: 2022-01-20 | Disposition: A | Discharge status: Home / Self Care | Payer: Medicare Other | Source: Ambulatory Visit | Attending: Physician Assistant | Admitting: Physician Assistant

## 2022-01-20 VITALS — BP 155/62 | HR 87 | Temp 99.8°F | Resp 19

## 2022-01-20 DIAGNOSIS — J40 Bronchitis, not specified as acute or chronic: Secondary | ICD-10-CM

## 2022-01-20 DIAGNOSIS — R509 Fever, unspecified: Secondary | ICD-10-CM | POA: Diagnosis not present

## 2022-01-20 DIAGNOSIS — R9389 Abnormal findings on diagnostic imaging of other specified body structures: Secondary | ICD-10-CM | POA: Diagnosis not present

## 2022-01-20 DIAGNOSIS — J329 Chronic sinusitis, unspecified: Secondary | ICD-10-CM | POA: Diagnosis not present

## 2022-01-20 DIAGNOSIS — R059 Cough, unspecified: Secondary | ICD-10-CM

## 2022-01-20 DIAGNOSIS — R051 Acute cough: Secondary | ICD-10-CM

## 2022-01-20 MED ORDER — DOXYCYCLINE HYCLATE 100 MG PO CAPS: 100.0000 mg | ORAL_CAPSULE | Freq: Two times a day (BID) | ORAL | 0 refills | Status: DC

## 2022-01-20 MED ORDER — BENZONATATE 100 MG PO CAPS: 100.0000 mg | ORAL_CAPSULE | Freq: Three times a day (TID) | ORAL | 0 refills | Status: DC

## 2022-01-20 NOTE — ED Provider Notes (Signed)
[place]    CSN: [id number] Arrival date & time: 01/20/22  1431      History   Chief Complaint Chief Complaint  Patient presents with   Cough    Severely congested, fever,coughTested negative for covid (home test) - Entered by patient   appt 3   Fever    HPI Candace Stewart is a 81 y.o. female.   Patient presents today with a 4 to 5-day history of URI symptoms.  Reports cough, nasal congestion, fever over with Tmax 101 F yesterday.  She denies chest pain, shortness of breath, nausea, vomiting.  She has tried Tylenol without improvement of symptoms.  Denies any known sick contacts.  She has had COVID-19 vaccines.  She took a 2 at-home COVID test that were negative when symptoms began.  She does have a history of allergies but is not currently taking any allergy medication.  Denies history of asthma, COPD, smoking.  Denies history of diabetes.  Denies any recent antibiotic or steroid use.  She does report symptoms are worsening prompting evaluation today.    Past Medical History:  Diagnosis Date   Allergic rhinitis    Anxiety    Arthritis    COVID-19    Depression    Fibrocystic breast disease    GERD (gastroesophageal reflux disease)    w/ LPR   Hemorrhoids    Hiatal hernia    EGD   Hypercholesteremia    Hypertension    Mild depression    Osteopenia    Osteoporosis    Prolapsed uterus 1973   with TAH   Tubular adenoma of colon 05/2009    Patient Active Problem List   Diagnosis Date Noted   Anxiety 11/15/2020   Chest pain 11/15/2020   Cough 11/15/2020   Fever 11/15/2020   Hyperlipidemia 11/15/2020   Hypertension 11/15/2020   Osteopenia 11/15/2020   Other specified counseling 11/15/2020   Pneumonia 11/15/2020   Postmenopausal 11/15/2020   Pure hypercholesterolemia 11/15/2020   Upper respiratory infection 11/15/2020   Female bladder prolapse 01/20/2017   GERD 05/14/2009   DYSPHAGIA 05/14/2009    Past Surgical History:  Procedure  Laterality Date   ABDOMINAL HYSTERECTOMY     CARPAL TUNNEL RELEASE     LEFT HEART CATHETERIZATION WITH CORONARY ANGIOGRAM N/A 01/17/2013   Procedure: LEFT HEART CATHETERIZATION WITH CORONARY ANGIOGRAM;  Surgeon: Laverda Page, MD;  Location: [facility] CATH LAB;  Service: Cardiovascular;  Laterality: N/A;   pilonidal cyst surgery     age 80   right arm surgery     TONSILLECTOMY      OB History   No obstetric history on file.      Home Medications    Prior to Admission medications   Medication Sig Start Date End Date Taking? Authorizing Provider  benzonatate (TESSALON) 100 MG capsule Take 1 capsule (100 mg total) by mouth every 8 (eight) hours. 01/20/22  Yes Lasean Gorniak, Derry Skill, PA-C  acetaminophen (TYLENOL) 325 MG tablet Take 650 mg by mouth every 6 (six) hours as needed.    [provider]  Calcium Carbonate-Vitamin D3 600-400 MG-UNIT TABS Take by mouth 2 (two) times daily.    [provider]  doxycycline (VIBRAMYCIN) 100 MG capsule Take 1 capsule (100 mg total) by mouth 2 (two) times daily. 01/20/22   Nicole Defino, Derry Skill, PA-C  esomeprazole (NEXIUM) 40 MG capsule Take 40 mg by mouth daily before breakfast.    [provider]  LORazepam (ATIVAN) 0.5 MG tablet  Take 0.5 mg by mouth every 8 (eight) hours as needed for anxiety.    [provider]  meloxicam (MOBIC) 15 MG tablet TAKE 1 TABLET (15 MG TOTAL) BY MOUTH DAILY. 08/07/21   Hyatt, Max T, DPM  metoprolol succinate (TOPROL-XL) 25 MG 24 hr tablet Take 25 mg by mouth daily. 09/13/19   [provider]  Multiple Vitamin (MULTIVITAMIN WITH MINERALS) TABS Take 1 tablet by mouth daily.    [provider]  triamcinolone cream (KENALOG) 0.1 % Apply 1 application topically 2 (two) times daily. 12/15/20   Vanessa Kick, MD    Family History Family History  Problem Relation Age of Onset   Colon polyps Sister        deceased   Colon cancer Cousin 54   Colitis Other        Mother's side of family    Diabetes Father    Diabetes Son    Diabetes Sister        deceased   Diabetes Brother    Kidney disease Sister    Pulmonary Hypertension Sister        deceased   Stroke Sister        deceased   Osteoporosis Mother    Heart failure Mother     Social History Social History   Tobacco Use   Smoking status: Former    Types: Cigarettes   Smokeless tobacco: Never  Vaping Use   Vaping Use: Never used  Substance Use Topics   Alcohol use: No   Drug use: No     Allergies   Amoxicillin and Sulfonamide derivatives   Review of Systems Review of Systems  Constitutional:  Positive for activity change, fatigue and fever. Negative for appetite change.  HENT:  Positive for congestion and sinus pressure. Negative for sneezing and sore throat.   Respiratory:  Positive for cough. Negative for shortness of breath.   Cardiovascular:  Negative for chest pain.  Gastrointestinal:  Negative for abdominal pain, diarrhea, nausea and vomiting.  Neurological:  Negative for dizziness, light-headedness and headaches.     Physical Exam Triage Vital Signs ED Triage Vitals  Enc Vitals Group     BP 01/20/22 1517 (!) 155/62     Pulse Rate 01/20/22 1517 87     Resp 01/20/22 1517 19     Temp 01/20/22 1517 99.8 F (37.7 C)     Temp Source 01/20/22 1517 Oral     SpO2 01/20/22 1517 97 %     Weight --      Height --      Head Circumference --      Peak Flow --      Pain Score 01/20/22 1516 0     Pain Loc --      Pain Edu? --      Excl. in [employer]? --    No data found.  Updated Vital Signs BP (!) 155/62 (BP Location: Right Arm)   Pulse 87   Temp 99.8 F (37.7 C) (Oral)   Resp 19   SpO2 97%   Visual Acuity Right Eye Distance:   Left Eye Distance:   Bilateral Distance:    Right Eye Near:   Left Eye Near:    Bilateral Near:     Physical Exam Vitals reviewed.  Constitutional:      General: She is awake. She is not in acute distress.    Appearance: Normal appearance. She is  well-developed. She is not ill-appearing.  Comments: Very pleasant female appears stated age in no acute distress sitting comfortably in exam room  HENT:     Head: Normocephalic and atraumatic.     Right Ear: Tympanic membrane, ear canal and external ear normal. Tympanic membrane is not erythematous or bulging.     Left Ear: Ear canal and external ear normal. Tympanic membrane is retracted. Tympanic membrane is not erythematous or bulging.     Nose:     Right Sinus: Maxillary sinus tenderness present. No frontal sinus tenderness.     Left Sinus: Maxillary sinus tenderness present. No frontal sinus tenderness.     Mouth/Throat:     Pharynx: Uvula midline. Posterior oropharyngeal erythema present. No oropharyngeal exudate.     Comments: Erythema and drainage in posterior oropharynx Cardiovascular:     Rate and Rhythm: Normal rate and regular rhythm.     Heart sounds: S1 normal and S2 normal. Murmur heard.  Pulmonary:     Effort: Pulmonary effort is normal.     Breath sounds: Rhonchi present. No wheezing or rales.     Comments: Scattered rhonchi clear with cough Psychiatric:        Behavior: Behavior is cooperative.      UC Treatments / Results  Labs (all labs ordered are listed, but only abnormal results are displayed) Labs Reviewed - No data to display  EKG   Radiology DG Chest 2 View  Result Date: 01/20/2022 CLINICAL DATA:  Cough, fever EXAM: CHEST - 2 VIEW COMPARISON:  01/16/2013 FINDINGS: Cardiac size is within normal limits. Lung fields are clear of any infiltrates or pulmonary edema. There is no pleural effusion or pneumothorax. There is 6.4 cm soft tissue fullness in the retrocardiac region possibly suggesting small fixed hiatal hernia. Other possibilities would include aneurysmal dilation of descending thoracic aorta or some other mediastinal pathology. This finding was not seen in the examination of 01/16/2013. IMPRESSION: There are no signs of pulmonary edema or focal  pulmonary consolidation. There is no pleural effusion. 6.4 cm soft tissue density seen in the retrocardiac region may suggest interval appearance of fixed hiatal hernia. Other less likely possibilities would include aneurysmal dilation of descending thoracic aorta or some other soft tissue mass in the mediastinum. Follow-up CT chest may be considered. Electronically Signed   By: Elmer Picker M.D.   On: 01/20/2022 16:12    Procedures Procedures (including critical care time)  Medications Ordered in UC Medications - No data to display  Initial Impression / Assessment and Plan / UC Course  I have reviewed the triage vital signs and the nursing notes.  Pertinent labs & imaging results that were available during my care of the patient were reviewed by me and considered in my medical decision making (see chart for details).     X-ray was obtained given rhonchi which showed no evidence of consolidation but did show 6.4 cm soft tissue density likely related to hiatal hernia but could not rule out aneurysm.  Discussed this with patient who reported a prolonged history of known hiatal hernia.  There is discussion of this and endoscopy report from 2021.  Patient denies any chest discomfort or fullness and so we will defer CT.  Recommend she follow-up closely with her primary care provider to determine if additional imaging including chest CT is reasonable.  Given worsening symptoms will cover with antibiotic.  She was started on doxycycline 100 mg twice daily for 7 days with instruction to avoid prolonged sun exposure with this medication due to  photosensitivity.  Can use Tylenol, Mucinex, Flonase for symptom relief.  She was given Tessalon for cough.  She is to rest and drink plenty fluid.  Recommended follow-up with PCP as soon as possible.  Discussed that if she has any worsening symptoms she needs to go to the emergency room immediately to which she expressed understanding.  Discussed case with Dr.  Windy Carina who agreed with treatment plan.  Final Clinical Impressions(s) / UC Diagnoses   Final diagnoses:  Sinobronchitis  Acute cough  Abnormal chest x-ray     Discharge Instructions      Please follow-up with your primary care provider soon as possible to ensure that you do not need follow-up of the abnormal chest x-ray today.  We are assuming this is because of your hiatal hernia since there is record of this, however, if you develop any chest discomfort or fullness you need to go to the emergency room immediately.  We are treating you for sinobronchitis.  Take doxycycline 100 mg twice daily for 7 days.  Stay out of the sun while on this medication.  Use Mucinex, Flonase, Tylenol for symptom relief.  Use Tessalon for cough.  If you have any worsening symptoms you need to go to the hospital as we discussed.     ED Prescriptions     Medication Sig Dispense Auth. Provider   doxycycline (VIBRAMYCIN) 100 MG capsule Take 1 capsule (100 mg total) by mouth 2 (two) times daily. 14 capsule Shepherd Finnan K, PA-C   benzonatate (TESSALON) 100 MG capsule Take 1 capsule (100 mg total) by mouth every 8 (eight) hours. 21 capsule Greyson Peavy K, PA-C      PDMP not reviewed this encounter.   Terrilee Croak, PA-C 01/20/22 1643

## 2022-01-20 NOTE — ED Triage Notes (Signed)
Pt c/o cough, congestion, fevers since Friday. Took tylenol last night for fever, none today

## 2022-01-20 NOTE — Discharge Instructions (Addendum)
Please follow-up with your primary care provider soon as possible to ensure that you do not need follow-up of the abnormal chest x-ray today.  We are assuming this is because of your hiatal hernia since there is record of this, however, if you develop any chest discomfort or fullness you need to go to the emergency room immediately.  We are treating you for sinobronchitis.  Take doxycycline 100 mg twice daily for 7 days.  Stay out of the sun while on this medication.  Use Mucinex, Flonase, Tylenol for symptom relief.  Use Tessalon for cough.  If you have any worsening symptoms you need to go to the hospital as we discussed.

## 2022-06-25 ENCOUNTER — Observation Stay (HOSPITAL_COMMUNITY)
Admission: EM | Admit: 2022-06-25 | Discharge: 2022-06-26 | Disposition: A | Discharge status: Home / Self Care | Payer: Medicare Other | Attending: Internal Medicine | Admitting: Internal Medicine

## 2022-06-25 ENCOUNTER — Emergency Department (HOSPITAL_COMMUNITY): Payer: Medicare Other

## 2022-06-25 ENCOUNTER — Observation Stay (HOSPITAL_COMMUNITY): Payer: Medicare Other

## 2022-06-25 ENCOUNTER — Encounter (HOSPITAL_COMMUNITY): Payer: Self-pay

## 2022-06-25 ENCOUNTER — Other Ambulatory Visit (HOSPITAL_COMMUNITY): Payer: Medicare Other

## 2022-06-25 DIAGNOSIS — R079 Chest pain, unspecified: Secondary | ICD-10-CM | POA: Diagnosis not present

## 2022-06-25 DIAGNOSIS — R2689 Other abnormalities of gait and mobility: Secondary | ICD-10-CM | POA: Diagnosis not present

## 2022-06-25 DIAGNOSIS — I10 Essential (primary) hypertension: Secondary | ICD-10-CM | POA: Diagnosis not present

## 2022-06-25 DIAGNOSIS — Z8616 Personal history of COVID-19: Secondary | ICD-10-CM | POA: Diagnosis not present

## 2022-06-25 DIAGNOSIS — Z8249 Family history of ischemic heart disease and other diseases of the circulatory system: Secondary | ICD-10-CM | POA: Diagnosis not present

## 2022-06-25 DIAGNOSIS — Z7982 Long term (current) use of aspirin: Secondary | ICD-10-CM | POA: Insufficient documentation

## 2022-06-25 DIAGNOSIS — K219 Gastro-esophageal reflux disease without esophagitis: Secondary | ICD-10-CM | POA: Diagnosis present

## 2022-06-25 DIAGNOSIS — R2990 Unspecified symptoms and signs involving the nervous system: Secondary | ICD-10-CM | POA: Diagnosis not present

## 2022-06-25 DIAGNOSIS — E7800 Pure hypercholesterolemia, unspecified: Secondary | ICD-10-CM | POA: Diagnosis not present

## 2022-06-25 DIAGNOSIS — F419 Anxiety disorder, unspecified: Secondary | ICD-10-CM | POA: Diagnosis present

## 2022-06-25 DIAGNOSIS — R4182 Altered mental status, unspecified: Secondary | ICD-10-CM | POA: Diagnosis not present

## 2022-06-25 DIAGNOSIS — I160 Hypertensive urgency: Secondary | ICD-10-CM | POA: Diagnosis present

## 2022-06-25 DIAGNOSIS — Z79899 Other long term (current) drug therapy: Secondary | ICD-10-CM | POA: Insufficient documentation

## 2022-06-25 DIAGNOSIS — Z7902 Long term (current) use of antithrombotics/antiplatelets: Secondary | ICD-10-CM | POA: Insufficient documentation

## 2022-06-25 DIAGNOSIS — G459 Transient cerebral ischemic attack, unspecified: Secondary | ICD-10-CM | POA: Diagnosis not present

## 2022-06-25 DIAGNOSIS — E785 Hyperlipidemia, unspecified: Secondary | ICD-10-CM | POA: Diagnosis present

## 2022-06-25 DIAGNOSIS — Z87891 Personal history of nicotine dependence: Secondary | ICD-10-CM | POA: Insufficient documentation

## 2022-06-25 DIAGNOSIS — R531 Weakness: Secondary | ICD-10-CM | POA: Diagnosis present

## 2022-06-25 LAB — DIFFERENTIAL
Abs Immature Granulocytes: 0.02 10*3/uL (ref 0.00–0.07)
Basophils Absolute: 0 10*3/uL (ref 0.0–0.1)
Basophils Relative: 1 %
Eosinophils Absolute: 0 10*3/uL (ref 0.0–0.5)
Eosinophils Relative: 1 %
Immature Granulocytes: 0 %
Lymphocytes Relative: 34 %
Lymphs Abs: 3 10*3/uL (ref 0.7–4.0)
Monocytes Absolute: 0.6 10*3/uL (ref 0.1–1.0)
Monocytes Relative: 7 %
Neutro Abs: 5 10*3/uL (ref 1.7–7.7)
Neutrophils Relative %: 57 %

## 2022-06-25 LAB — I-STAT CHEM 8, ED
BUN: 19 mg/dL (ref 8–23)
Calcium, Ion: 1.08 mmol/L — ABNORMAL LOW (ref 1.15–1.40)
Chloride: 97 mmol/L — ABNORMAL LOW (ref 98–111)
Creatinine, Ser: 0.7 mg/dL (ref 0.44–1.00)
Glucose, Bld: 132 mg/dL — ABNORMAL HIGH (ref 70–99)
HCT: 45 % (ref 36.0–46.0)
Hemoglobin: 15.3 g/dL — ABNORMAL HIGH (ref 12.0–15.0)
Potassium: 3.8 mmol/L (ref 3.5–5.1)
Sodium: 131 mmol/L — ABNORMAL LOW (ref 135–145)
TCO2: 23 mmol/L (ref 22–32)

## 2022-06-25 LAB — COMPREHENSIVE METABOLIC PANEL
ALT: 16 U/L (ref 0–44)
AST: 21 U/L (ref 15–41)
Albumin: 3.8 g/dL (ref 3.5–5.0)
Alkaline Phosphatase: 76 U/L (ref 38–126)
Anion gap: 12 (ref 5–15)
BUN: 18 mg/dL (ref 8–23)
CO2: 24 mmol/L (ref 22–32)
Calcium: 9.2 mg/dL (ref 8.9–10.3)
Chloride: 96 mmol/L — ABNORMAL LOW (ref 98–111)
Creatinine, Ser: 0.79 mg/dL (ref 0.44–1.00)
GFR, Estimated: >60 mL/min (ref >60)
Glucose, Bld: 135 mg/dL — ABNORMAL HIGH (ref 70–99)
Potassium: 3.8 mmol/L (ref 3.5–5.1)
Sodium: 132 mmol/L — ABNORMAL LOW (ref 135–145)
Total Bilirubin: 0.4 mg/dL (ref 0.3–1.2)
Total Protein: 7 g/dL (ref 6.5–8.1)

## 2022-06-25 LAB — CBC
HCT: 42 % (ref 36.0–46.0)
HCT: 39.5 % (ref 36.0–46.0)
Hemoglobin: 14.3 g/dL (ref 12.0–15.0)
Hemoglobin: 12.8 g/dL (ref 12.0–15.0)
MCH: 31 pg (ref 26.0–34.0)
MCH: 30.4 pg (ref 26.0–34.0)
MCHC: 34 g/dL (ref 30.0–36.0)
MCHC: 32.4 g/dL (ref 30.0–36.0)
MCV: 90.9 fL (ref 80.0–100.0)
MCV: 93.8 fL (ref 80.0–100.0)
Platelets: 357 10*3/uL (ref 150–400)
Platelets: 346 10*3/uL (ref 150–400)
RBC: 4.62 MIL/uL (ref 3.87–5.11)
RBC: 4.21 MIL/uL (ref 3.87–5.11)
RDW: 13.1 % (ref 11.5–15.5)
RDW: 13.2 % (ref 11.5–15.5)
WBC: 8.7 10*3/uL (ref 4.0–10.5)
WBC: 8.1 10*3/uL (ref 4.0–10.5)
nRBC: 0 % (ref 0.0–0.2)
nRBC: 0 % (ref 0.0–0.2)

## 2022-06-25 LAB — CBG MONITORING, ED: Glucose-Capillary: 92 mg/dL (ref 70–99)

## 2022-06-25 LAB — ETHANOL: Alcohol, Ethyl (B): <10 mg/dL (ref <10)

## 2022-06-25 LAB — CREATININE, SERUM
Creatinine, Ser: 0.61 mg/dL (ref 0.44–1.00)
GFR, Estimated: >60 mL/min (ref >60)

## 2022-06-25 LAB — PROTIME-INR
INR: 1 (ref 0.8–1.2)
Prothrombin Time: 12.6 seconds (ref 11.4–15.2)

## 2022-06-25 LAB — APTT: aPTT: 29 seconds (ref 24–36)

## 2022-06-25 LAB — TROPONIN I (HIGH SENSITIVITY)
Troponin I (High Sensitivity): 11 ng/L (ref <18)
Troponin I (High Sensitivity): 32 ng/L — ABNORMAL HIGH (ref <18)
Troponin I (High Sensitivity): 49 ng/L — ABNORMAL HIGH (ref <18)

## 2022-06-25 LAB — TSH: TSH: 1.247 u[IU]/mL (ref 0.350–4.500)

## 2022-06-25 MED ORDER — ASPIRIN 81 MG PO TBEC
81.0000 mg | DELAYED_RELEASE_TABLET | Freq: Every day | ORAL | Status: DC
  Administered 2022-06-25 – 2022-06-26 (×2): 81 mg via ORAL
  Filled 2022-06-25 (×2): qty 1

## 2022-06-25 MED ORDER — CALCIUM CARBONATE-VITAMIN D3 600-400 MG-UNIT PO TABS: ORAL_TABLET | Freq: Two times a day (BID) | ORAL | Status: DC

## 2022-06-25 MED ORDER — SODIUM CHLORIDE 0.9% FLUSH
3.0000 mL | Freq: Once | INTRAVENOUS | Status: AC
  Administered 2022-06-25: 3 mL via INTRAVENOUS

## 2022-06-25 MED ORDER — ENOXAPARIN SODIUM 40 MG/0.4ML IJ SOSY
40.0000 mg | PREFILLED_SYRINGE | INTRAMUSCULAR | Status: DC
  Administered 2022-06-25: 40 mg via SUBCUTANEOUS
  Filled 2022-06-25: qty 0.4

## 2022-06-25 MED ORDER — SODIUM CHLORIDE 0.9% FLUSH
3.0000 mL | Freq: Two times a day (BID) | INTRAVENOUS | Status: DC
  Administered 2022-06-25 – 2022-06-26 (×3): 3 mL via INTRAVENOUS

## 2022-06-25 MED ORDER — OYSTER SHELL CALCIUM/D3 500-5 MG-MCG PO TABS
1.0000 | ORAL_TABLET | Freq: Two times a day (BID) | ORAL | Status: DC
  Administered 2022-06-26: 1 via ORAL
  Filled 2022-06-25: qty 1

## 2022-06-25 MED ORDER — LORAZEPAM 1 MG PO TABS: 0.5000 mg | ORAL_TABLET | Freq: Three times a day (TID) | ORAL | Status: DC | PRN

## 2022-06-25 MED ORDER — ACETAMINOPHEN 650 MG RE SUPP: 650.0000 mg | Freq: Four times a day (QID) | RECTAL | Status: DC | PRN

## 2022-06-25 MED ORDER — ONDANSETRON HCL 4 MG/2ML IJ SOLN: 4.0000 mg | Freq: Four times a day (QID) | INTRAMUSCULAR | Status: DC | PRN

## 2022-06-25 MED ORDER — LORAZEPAM 2 MG/ML IJ SOLN
0.5000 mg | INTRAMUSCULAR | Status: DC | PRN
  Administered 2022-06-25: 0.5 mg via INTRAVENOUS
  Filled 2022-06-25: qty 1

## 2022-06-25 MED ORDER — METOPROLOL SUCCINATE ER 25 MG PO TB24
25.0000 mg | ORAL_TABLET | Freq: Every day | ORAL | Status: DC
  Administered 2022-06-25: 25 mg via ORAL
  Filled 2022-06-25: qty 1

## 2022-06-25 MED ORDER — CLOPIDOGREL BISULFATE 75 MG PO TABS
75.0000 mg | ORAL_TABLET | Freq: Every day | ORAL | Status: DC
  Administered 2022-06-25 – 2022-06-26 (×2): 75 mg via ORAL
  Filled 2022-06-25 (×2): qty 1

## 2022-06-25 MED ORDER — DILTIAZEM HCL 30 MG PO TABS
30.0000 mg | ORAL_TABLET | Freq: Three times a day (TID) | ORAL | Status: DC
  Filled 2022-06-25 (×4): qty 1

## 2022-06-25 MED ORDER — PANTOPRAZOLE SODIUM 40 MG PO TBEC
40.0000 mg | DELAYED_RELEASE_TABLET | Freq: Every day | ORAL | Status: DC
  Administered 2022-06-26: 40 mg via ORAL
  Filled 2022-06-25: qty 1

## 2022-06-25 MED ORDER — ACETAMINOPHEN 325 MG PO TABS
650.0000 mg | ORAL_TABLET | Freq: Four times a day (QID) | ORAL | Status: DC | PRN
  Administered 2022-06-25: 650 mg via ORAL
  Filled 2022-06-25: qty 2

## 2022-06-25 MED ORDER — ADULT MULTIVITAMIN W/MINERALS CH
1.0000 | ORAL_TABLET | Freq: Every day | ORAL | Status: DC
  Administered 2022-06-26: 1 via ORAL
  Filled 2022-06-25: qty 1

## 2022-06-25 MED ORDER — IOHEXOL 350 MG/ML SOLN
75.0000 mL | Freq: Once | INTRAVENOUS | Status: AC | PRN
  Administered 2022-06-25: 75 mL via INTRAVENOUS

## 2022-06-25 MED ORDER — ONDANSETRON HCL 4 MG PO TABS: 4.0000 mg | ORAL_TABLET | Freq: Four times a day (QID) | ORAL | Status: DC | PRN

## 2022-06-25 MED ORDER — METOPROLOL SUCCINATE ER 25 MG PO TB24
25.0000 mg | ORAL_TABLET | Freq: Every day | ORAL | Status: DC
  Administered 2022-06-26: 25 mg via ORAL
  Filled 2022-06-25: qty 1

## 2022-06-25 NOTE — Progress Notes (Signed)
EEG complete - results pending 

## 2022-06-25 NOTE — Consult Note (Addendum)
NEUROLOGY CONSULTATION NOTE   Date of service: June 25, 2022 Patient Name: Candace Stewart MRN:  [id number] DOB:  [birth year] Reason for consult: "Stroke code for blurred vision, generalized weakness" Requesting Provider: No att. providers found _ _ _   _ __   _ __ _ _  __ __   _ __   __ _  History of Present Illness  Candace Stewart is a 81 y.o. female with PMH significant for depression, GERD, HTN, HLD, Osteoporosis, hemorrhoids, tubular adenoma of colon who presents with more than a week history of unsteady gait. This AM, got up at 0400 and around 0800, she was watching TV and was having a hard time reading the ends of the words. She called her daughter, was having a hard time talking to her. Speech sounded garbled and slurred. This lasted for about 20 mins. Also reports that she felt like she ws going to passout. Had to really try hard to prevent herself from passing out. Ems called. Per EMS, they did orthostatics in the field and her BP dropped by 10 only. She was hypertensive to 409W systolic. By the time, she got to the ED, she only complained of generalized weakness and blurred vision BL.   She denies any prior history of stroke, no history of diabetes.  Does endorse history of hypertension hyperlipidemia and takes metoprolol and Nexium at home only.  She does not smoke, does not drink alcohol, no recreational substances. Sister had  stroke. Walking and.,  Presenting denies I reviewed the medical   LKW: 0800 on 06/25/2022. mRS: 1 tNKASE: Not offered due to resolution of symptoms. Thrombectomy: Not offered due to resolution of symptoms. NIHSS components Score: Comment  1a Level of Conscious 0'[x]'$  1'[]'$  2'[]'$  3'[]'$      1b LOC Questions 0'[x]'$  1'[]'$  2'[]'$       1c LOC Commands 0'[x]'$  1'[]'$  2'[]'$       2 Best Gaze 0'[x]'$  1'[]'$  2'[]'$       3 Visual 0'[x]'$  1'[]'$  2'[]'$  3'[]'$      4 Facial Palsy 0'[x]'$  1'[]'$  2'[]'$  3'[]'$      5a Motor Arm - left 0'[x]'$  1'[]'$  2'[]'$  3'[]'$  4'[]'$  UN'[]'$    5b Motor Arm - Right 0'[x]'$  1'[]'$  2'[]'$  3'[]'$  4'[]'$  UN'[]'$    6a Motor  Leg - Left 0'[x]'$  1'[]'$  2'[]'$  3'[]'$  4'[]'$  UN'[]'$    6b Motor Leg - Right 0'[x]'$  1'[]'$  2'[]'$  3'[]'$  4'[]'$  UN'[]'$    7 Limb Ataxia 0'[x]'$  1'[]'$  2'[]'$  3'[]'$  UN'[]'$     8 Sensory 0'[x]'$  1'[]'$  2'[]'$  UN'[]'$      9 Best Language 0'[x]'$  1'[]'$  2'[]'$  3'[]'$      10 Dysarthria 0'[x]'$  1'[]'$  2'[]'$  UN'[]'$      11 Extinct. and Inattention 0'[x]'$  1'[]'$  2'[]'$       TOTAL: 0     ROS   Constitutional Denies weight loss, fever and chills.   HEENT Denies changes in vision and hearing.   Respiratory Denies SOB and cough.   CV Denies palpitations and CP   GI Denies abdominal pain, nausea, vomiting and diarrhea.   GU Denies dysuria and urinary frequency.   MSK Denies myalgia and joint pain.   Skin Denies rash and pruritus.   Neurological Denies headache and syncope.   Psychiatric Denies recent changes in mood. Denies anxiety and depression.    Past History   Past Medical History:  Diagnosis Date   Allergic rhinitis    Anxiety    Arthritis    COVID-19    Depression    Fibrocystic breast disease  GERD (gastroesophageal reflux disease)    w/ LPR   Hemorrhoids    Hiatal hernia    EGD   Hypercholesteremia    Hypertension    Mild depression    Osteopenia    Osteoporosis    Prolapsed uterus 1973   with TAH   Tubular adenoma of colon 05/2009   Past Surgical History:  Procedure Laterality Date   ABDOMINAL HYSTERECTOMY     CARPAL TUNNEL RELEASE     LEFT HEART CATHETERIZATION WITH CORONARY ANGIOGRAM N/A 01/17/2013   Procedure: LEFT HEART CATHETERIZATION WITH CORONARY ANGIOGRAM;  Surgeon: Laverda Page, MD;  Location: [facility] CATH LAB;  Service: Cardiovascular;  Laterality: N/A;   pilonidal cyst surgery     age 61   right arm surgery     TONSILLECTOMY     Family History  Problem Relation Age of Onset   Colon polyps Sister        deceased   Colon cancer Cousin 57   Colitis Other        Mother's side of family   Diabetes Father    Diabetes Son    Diabetes Sister        deceased   Diabetes Brother    Kidney disease Sister    Pulmonary Hypertension Sister         deceased   Stroke Sister        deceased   Osteoporosis Mother    Heart failure Mother    Social History   Socioeconomic History   Marital status: Married    Spouse name: Not on file   Number of children: Not on file   Years of education: Not on file   Highest education level: Not on file  Occupational History   Not on file  Tobacco Use   Smoking status: Former    Types: Cigarettes   Smokeless tobacco: Never  Vaping Use   Vaping Use: Never used  Substance and Sexual Activity   Alcohol use: No   Drug use: No   Sexual activity: Not on file  Other Topics Concern   Not on file  Social History Narrative   Not on file   Social Determinants of Health   Financial Resource Strain: Not on file  Food Insecurity: Not on file  Transportation Needs: Not on file  Physical Activity: Not on file  Stress: Not on file  Social Connections: Not on file   Allergies  Allergen Reactions   Amoxicillin Diarrhea   Sulfonamide Derivatives     unknown    Medications  (Not in a hospital admission)    Vitals   Vitals:   06/25/22 0900  Weight: 69.2 kg  Height: 5' (1.524 m)     Body mass index is 29.79 kg/m.  Physical Exam   General: Laying comfortably in bed; in no acute distress.  HENT: Normal oropharynx and mucosa. Normal external appearance of ears and nose.  Neck: Supple, no pain or tenderness  CV: No JVD. No peripheral edema.  Pulmonary: Symmetric Chest rise. Normal respiratory effort.  Abdomen: Soft to touch, non-tender.  Ext: No cyanosis, edema, or deformity  Skin: No rash. Normal palpation of skin.   Musculoskeletal: Normal digits and nails by inspection. No clubbing.   Neurologic Examination  Mental status/Cognition: Alert, oriented to self, place, month and year, good attention.  Speech/language: Fluent, comprehension intact, object naming intact, repetition intact.  Cranial nerves:   CN II Pupils equal and reactive to light, no VF deficits  CN III,IV,VI  EOM intact, no gaze preference or deviation, gaze evoked direction changing nystagmus noted on right horizontal and left horizontal gaze.   CN V normal sensation in V1, V2, and V3 segments bilaterally    CN VII no asymmetry, no nasolabial fold flattening    CN VIII normal hearing to speech    CN IX & X normal palatal elevation, no uvular deviation    CN XI 5/5 head turn and 5/5 shoulder shrug bilaterally    CN XII midline tongue protrusion    Motor:  Muscle bulk: poor, tonenormal, pronator drift none tremor: no tremor Mvmt Root Nerve  Muscle Right Left Comments  SA C5/6 Ax Deltoid 5 5   EF C5/6 Mc Biceps 5 5   EE C6/7/8 Rad Triceps 5 5   WF C6/7 Med FCR     WE C7/8 PIN ECU     F Ab C8/T1 U ADM/FDI 5 5   HF L1/2/3 Fem Illopsoas 5 5   KE L2/3/4 Fem Quad 5 5   DF L4/5 D Peron Tib Ant 5 5   PF S1/2 Tibial Grc/Sol 5 5    Sensation:  Light touch Intact throughout   Pin prick    Temperature    Vibration   Proprioception    Coordination/Complex Motor:  - Finger to Nose intact bilaterally: - Heel to shin intact bilaterally - Rapid alternating movement are slowed - Gait: Deferred for patient's safety. Labs   CBC: No results for input(s): "WBC", "NEUTROABS", "HGB", "HCT", "MCV", "PLT" in the last 168 hours.  Basic Metabolic Panel:  Lab Results  Component Value Date   NA 136 01/16/2013   K 3.9 01/16/2013   CO2 30 01/16/2013   GLUCOSE 106 (H) 01/16/2013   BUN 15 01/16/2013   CREATININE 0.74 01/16/2013   CALCIUM 9.4 01/16/2013   GFRNONAA 83 (L) 01/16/2013   GFRAA >90 01/16/2013   Lipid Panel: No results found for: "LDLCALC" HgbA1c:  Lab Results  Component Value Date   HGBA1C 5.5 01/16/2013   Urine Drug Screen: No results found for: "LABOPIA", "COCAINSCRNUR", "LABBENZ", "AMPHETMU", "THCU", "LABBARB"  Alcohol Level No results found for: "ETH"  CT Head without contrast(Personally reviewed): CTH was negative for a large hypodensity concerning for a large territory infarct  or hyperdensity concerning for an ICH  CT angio Head and Neck with contrast: pending  MRI Brain: pending  rEEG:  pending  Impression   Candace Stewart is a 81 y.o. female with PMH significant for depression, GERD, HTN, HLD, Osteoporosis, hemorrhoids, tubular adenoma of colon who presents with more than a week history of unsteady gait. This morning around 0800, had an episode of trouble reading words on TV, garbled speech when talking to her daughter over phone but not particularly confused and felt like she was going to pass out with blurred and tunneling of her vision. Symptoms lasted about 20 mins and resolved by the time she got here.  She was hypertensive to 638 systolic when she came to the ED.  Overall, episode is concerning for potential TIA but also presyncopal symptoms. Other differential include carotid insufficiency, focal seizure, arhythmia, hypertensive emergency. EMS do report attempting orthostatics at home with only a drop of 10 in her systolics. She ws not hyper or hypoglycemic, no obvious abnormality noted on chemistry.  From a neuro standpoint, will treat this as a potential TIA.  Recommendations   - Frequent Neuro checks per stroke unit protocol - Recommend brain imaging with MRI Brain  without contrast - Recommend Vascular imaging with MRA Angio Head without contrast and US Carotid doppler - Recommend obtaining TTE - Recommend obtaining Lipid panel with LDL - Please start statin if LDL > 70 - Recommend HbA1c - Antithrombotic -aspirin 81 mg daily along with Plavix 75 mg daily for 21 days, followed by aspirin 81 mg daily alone. - Recommend DVT ppx - SBP goal - permissive hypertension first 24 h < 180(given elevated troponin per ED). Held home meds.  - Recommend Telemetry monitoring for arrythmia - Recommend bedside swallow screen prior to PO intake. - Stroke education booklet - Recommend PT/OT/SLP consult   Plan discussed with Dr.  Tomi Bamberger. ______________________________________________________________________   Thank you for the opportunity to take part in the care of this patient. If you have any further questions, please contact the neurology consultation attending.  Signed,  [place] Pager Number [phone] _ _ _   _ __   _ __ _ _  __ __   _ __   __ _

## 2022-06-25 NOTE — ED Notes (Signed)
Pt ambulated to the restroom with an assist of one. Pt steady on feet with slow gait.

## 2022-06-25 NOTE — Procedures (Signed)
Patient Name: Candace Stewart  MRN: [id number]  Epilepsy Attending: Lora Havens  Referring Physician/Provider: Donnetta Simpers, MD  Date: 06/25/2022 Duration: 22.35 mins  Patient history: 81 y.o. female presents with more than a week history of unsteady gait. This morning around 0800, had an episode of trouble reading words on TV, garbled speech when talking to her daughter over phone but not particularly confused and felt like she was going to pass out with blurred and tunneling of her vision. Symptoms lasted about 20 mins and resolved by the time she got here. EEG to evaluate for seizure.  Level of alertness: Awake, asleep  AEDs during EEG study: Ativan  Technical aspects: This EEG study was done with scalp electrodes positioned according to the 10-20 International system of electrode placement. Electrical activity was reviewed with band pass filter of 1-'70Hz'$ , sensitivity of 7 uV/mm, display speed of 46m/sec with a '60Hz'$  notched filter applied as appropriate. EEG data were recorded continuously and digitally stored.  Video monitoring was available and reviewed as appropriate.  Description: The posterior dominant rhythm consists of 9-10 Hz activity of moderate voltage (25-35 uV) seen predominantly in posterior head regions, symmetric and reactive to eye opening and eye closing. Sleep was characterized by vertex waves, sleep spindles (12 to 14 Hz), maximal frontocentral region. Hyperventilation and photic stimulation were not performed.     IMPRESSION: This study is within normal limits. No seizures or epileptiform discharges were seen throughout the recording.  A normal interictal EEG does not exclude the diagnosis of epilepsy.  [name] [name]

## 2022-06-25 NOTE — ED Notes (Signed)
EEG at bedside.

## 2022-06-25 NOTE — Code Documentation (Signed)
Stroke Response Nurse Documentation Code Documentation  Candace Stewart is a 81 y.o. female arriving to [facility]  via [facility] on 06/25/2022 with past medical hx of HTN, HLP. Code stroke was activated by EMS.   Patient from home where she was LKW at 0800. Pt was at home watching TV when she had a sudden onset of blurred vision, weakness, and word difficulty. Pt called her daughter and reported that her speech sounded funny. Her daughter came home and called EMS. Upon arrival of EMS< pt had no weakness but reported some word finding difficulty. Pt called a Code Stroke.  Stroke team at the bedside on patient arrival. Labs drawn and patient cleared for CT by Dr. Melina Copa. Patient to CT with team. NIHSS 0, see documentation for details and code stroke times. Upon arrival, no symptoms noted and patient had fully resolved. PT reports episode lasted 20 minutes. The following imaging was completed:  CT Head. Patient is not a candidate for IV Thrombolytic due to symptoms resolved. Patient is not a candidate for IR due to no LVO detected.   Care Plan: q 2 x 12 hours. TIA alert.   Bedside handoff with ED RN Terese Door  Stroke Response RN

## 2022-06-25 NOTE — ED Notes (Signed)
Walked patient to the bathroom patient did well back in bed on the monitor family at bedside

## 2022-06-25 NOTE — ED Triage Notes (Signed)
Pt BIBGEMS for stroke like symptoms that began at 0800 lasting approximately 20 minutes. Per EMS and pt, pt was watching tv when her vision became blurry, she had left sided weakness, her right arm felt "like it was asleep," and she had expressive aphasia. Pt on arrival is alert and oriented with no neuro deficits noted.

## 2022-06-25 NOTE — ED Notes (Signed)
Candace P. Called from lab troponin level from 11 now 32.

## 2022-06-25 NOTE — ED Notes (Signed)
NP and hospitalist at bedside.

## 2022-06-25 NOTE — H&P (Signed)
History and Physical    Patient: Candace Stewart QHU:[id number] DOB: May 12, 1941 DOA: 06/25/2022 DOS: the patient was seen and examined on 06/25/2022 PCP: Jani Gravel, MD  Patient coming from: Home  Chief Complaint:  Chief Complaint  Patient presents with   Code Stroke   HPI: Candace Stewart is a 81 y.o. 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  She also has a history of GERD and anxiety.  Developed strokelike symptoms this morning around 8:00.  She initially awakened around 4 AM and was not having any symptoms and went back to bed.  At 8 AM she was watching television when her vision became blurry and she reported left-sided weakness and it felt like her right arm fell asleep.  She also had word finding difficulty.    By the time she arrived to the ER she was alert and oriented without any neurological deficits.  Code stroke was called and again symptoms not consistent with acute CVA/code stroke was canceled.  Neurologist with concerns of possible TIA though.  Patient was found to have significant hypertension.  BP at presentation was 216/88 with most recent blood pressure 163/73.  She has also been having some intermittent chest tightness over the left anterior chest that she states began after she received a COVID booster shot over a year ago.  As noted she has had a prior cath in 2018 that revealed a mid LAD intramyocardial bridge and was to start a calcium channel blocker.  Her current medication list does not show a calcium channel blocker.  All imaging in the ER was unremarkable.  She did have mild hyponatremia with a sodium of 132, her initial troponin was 11 with a second troponin elevated at 32.  EKG unremarkable.  And is still place patient in observational status, continue telemetry and continue to trend  troponin and consult her primary cardiologist Dr. Einar Gip.   Review of Systems: As mentioned in the history of present illness. All other systems reviewed and are negative. Past Medical History:  Diagnosis Date   Allergic rhinitis    Anxiety    Arthritis    COVID-19    Depression    Fibrocystic breast disease    GERD (gastroesophageal reflux disease)    w/ LPR   Hemorrhoids    Hiatal hernia    EGD   Hypercholesteremia    Hypertension    Mild depression    Osteopenia    Osteoporosis    Prolapsed uterus 1973   with TAH   Tubular adenoma of colon 05/2009   Past Surgical History:  Procedure Laterality Date   ABDOMINAL HYSTERECTOMY     CARPAL TUNNEL RELEASE     LEFT HEART CATHETERIZATION WITH CORONARY ANGIOGRAM N/A 01/17/2013   Procedure: LEFT HEART CATHETERIZATION WITH CORONARY ANGIOGRAM;  Surgeon: Laverda Page, MD;  Location: [facility] CATH LAB;  Service: Cardiovascular;  Laterality: N/A;   pilonidal cyst surgery     age 52   right arm surgery     TONSILLECTOMY     Social History:  reports that she has quit smoking. Her smoking use included cigarettes. She has never used smokeless tobacco. She reports that she does not drink alcohol and does not use drugs.  Allergies  Allergen Reactions   Amoxicillin Diarrhea   Sulfonamide Derivatives     unknown  Family History  Problem Relation Age of Onset   Colon polyps Sister        deceased   Colon cancer Cousin 75   Colitis Other        Mother's side of family   Diabetes Father    Diabetes Son    Diabetes Sister        deceased   Diabetes Brother    Kidney disease Sister    Pulmonary Hypertension Sister        deceased   Stroke Sister        deceased   Osteoporosis Mother    Heart failure Mother     Prior to Admission medications   Medication Sig Start Date End Date Taking? Authorizing Provider  acetaminophen (TYLENOL) 325 MG tablet Take 650 mg by mouth every 6 (six) hours as needed.    [provider]   benzonatate (TESSALON) 100 MG capsule Take 1 capsule (100 mg total) by mouth every 8 (eight) hours. 01/20/22   Raspet, Derry Skill, PA-C  Calcium Carbonate-Vitamin D3 600-400 MG-UNIT TABS Take by mouth 2 (two) times daily.    [provider]  doxycycline (VIBRAMYCIN) 100 MG capsule Take 1 capsule (100 mg total) by mouth 2 (two) times daily. 01/20/22   Raspet, Derry Skill, PA-C  esomeprazole (NEXIUM) 40 MG capsule Take 40 mg by mouth daily before breakfast.    [provider]  LORazepam (ATIVAN) 0.5 MG tablet Take 0.5 mg by mouth every 8 (eight) hours as needed for anxiety.    [provider]  meloxicam (MOBIC) 15 MG tablet TAKE 1 TABLET (15 MG TOTAL) BY MOUTH DAILY. 08/07/21   Hyatt, Max T, DPM  metoprolol succinate (TOPROL-XL) 25 MG 24 hr tablet Take 25 mg by mouth daily. 09/13/19   [provider]  Multiple Vitamin (MULTIVITAMIN WITH MINERALS) TABS Take 1 tablet by mouth daily.    [provider]  triamcinolone cream (KENALOG) 0.1 % Apply 1 application topically 2 (two) times daily. 12/15/20   Vanessa Kick, MD    Physical Exam: Vitals:   06/25/22 1200 06/25/22 1215 06/25/22 1230 06/25/22 1435  BP: (!) 196/131 (!) 184/76 (!) 187/73 (!) 163/73  Pulse: 71 72 68 72  Resp: '17 20 15 12  '$ Temp:    (!) 97.5 F (36.4 C)  TempSrc:      SpO2: 98% 96% 97% 96%  Weight:      Height:       Constitutional: Pleasant older female in NAD, calm, comfortable Eyes: PERRL, lids and conjunctivae normal ENMT: Mucous membranes are moist. Posterior pharynx clear of any exudate or lesions.Normal dentition.  Neck: normal, supple, no masses, no thyromegaly Respiratory: clear to auscultation bilaterally, no wheezing, no crackles. Normal respiratory effort. No accessory muscle use.  Cardiovascular: Regular rate and rhythm, no murmurs / rubs / gallops. No extremity edema. 2+ pedal pulses. No carotid bruits.  Abdomen: no tenderness, no masses palpated. No hepatosplenomegaly. Bowel sounds  positive.  Musculoskeletal: no clubbing / cyanosis. No joint deformity upper and lower extremities. Good ROM, no contractures. Normal muscle tone.  Skin: no rashes, lesions, ulcers. No induration Neurologic: CN 2-12 grossly intact. Sensation intact, DTR normal. Strength 5/5 x all 4 extremities.  Of note, patient was ambulating independently from the bathroom.  She was able to independently crawl up into the stretcher in reposition self without difficulty. Psychiatric: Normal judgment and insight. Alert and oriented x 3. Normal mood.   Data Reviewed:  Laboratory data Sodium 132,  potassium 3.8, chloride 96, CO2 24, glucose 135, BUN 18, creatinine 0.79, LFTs are normal, initial troponin 11 with second troponin 32, white count 8.7, hemoglobin 14.3, platelets 357,000, coags are normal, glucose 135  Diagnostics CT head without contrast No hemorrhage or CT evidence of an acute infarct.   CT angiography 1. No large vessel occlusion. 2. 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 3. No hemodynamically significant stenosis of the neck. 4. Aortic atherosclerosis.  MRI brain 1. No acute intracranial abnormality. 2. Mild chronic microvascular ischemic changes of the white matter.    Assessment and Plan: Strokelike symptoms Current neurological exam unremarkable Current work-up negative for acute CVA but neurologist is concerned over possible TIA EEG pending Permissive hypertension, keep SBP greater than 180 recommendation of neurologist Check hemoglobin A1c, lipid panel and echocardiogram Neurochecks every 4 hours Begin low-dose aspirin and Plavix Telemetry monitoring PT/OT/SLP consultation  Hypertensive urgency Initial BP markedly elevated and has significantly improved after being given metoprolol 25 mg We will continue home metoprolol noting SBP most stay greater than 180 until further instructed by neurology  Chest  pain/history of mid LAD intramyocardial bridging (2018) EKG unremarkable Suspect elevated troponin related to demand ischemia from significant hypertension in context of known intramyocardial bridging Resume CCB-start Cardizem 30 mg every 8 hours with hold parameters if SBP less than 180 Pete troponin at 1800  Dyslipidemia Lipid panel Was not on pharmacotherapy prior to admission  GERD Continue PPI  Anxiety disorder Continue home med of as needed Ativan   Advance Care Planning:   Code Status: Full Code   VTE prophylaxis: Lovenox  Consults: Neurology, cardiology  Family Communication: Daughter and son at bedside  Severity of Illness: The appropriate patient status for this patient is OBSERVATION. Observation status is judged to be reasonable and necessary in order to provide the required intensity of service to ensure the patient's safety. The patient's presenting symptoms, physical exam findings, and initial radiographic and laboratory data in the context of their medical condition is felt to place them at decreased risk for further clinical deterioration. Furthermore, it is anticipated that the patient will be medically stable for discharge from the hospital within 2 midnights of admission.   Author: Erin Hearing, NP 06/25/2022 2:45 PM  For on call review [email or web address].

## 2022-06-25 NOTE — ED Provider Notes (Signed)
[facility] EMERGENCY DEPARTMENT Provider Note   CSN: [id number] Arrival date & time: 06/25/22  5638  An emergency department physician performed an initial assessment on this suspected stroke patient at 0926.  History  Chief Complaint  Patient presents with   Code Stroke    Candace Stewart is a 81 y.o. female.  HPI   Patient has history of hypertension, acid reflux, hyperlipidemia, hemorrhoids, tubular adenoma who presents to the ED for evaluation of an episode of difficulty reading, finding her words and weakness.  Patient was watching TV when this occurred this morning.  She initially got up around 4 AM.  Around 8 AM is when she notices symptoms.  She also noticed some blurred vision.  Patient states she called EMS and by the time she arrived she just had some generalized weakness and blurred vision bilaterally.  Patient was activated as a code stroke and was seen on arrival by the neurology team.  Patient tells me she is also noticed some mild left-sided chest discomfort.  Home Medications Prior to Admission medications   Medication Sig Start Date End Date Taking? Authorizing Provider  acetaminophen (TYLENOL) 325 MG tablet Take 650 mg by mouth every 6 (six) hours as needed.    [provider]  benzonatate (TESSALON) 100 MG capsule Take 1 capsule (100 mg total) by mouth every 8 (eight) hours. 01/20/22   Raspet, Derry Skill, PA-C  Calcium Carbonate-Vitamin D3 600-400 MG-UNIT TABS Take by mouth 2 (two) times daily.    [provider]  doxycycline (VIBRAMYCIN) 100 MG capsule Take 1 capsule (100 mg total) by mouth 2 (two) times daily. 01/20/22   Raspet, Derry Skill, PA-C  esomeprazole (NEXIUM) 40 MG capsule Take 40 mg by mouth daily before breakfast.    [provider]  LORazepam (ATIVAN) 0.5 MG tablet Take 0.5 mg by mouth every 8 (eight) hours as needed for anxiety.    [provider]  meloxicam (MOBIC) 15 MG tablet TAKE 1 TABLET (15 MG TOTAL)  BY MOUTH DAILY. 08/07/21   Hyatt, Max T, DPM  metoprolol succinate (TOPROL-XL) 25 MG 24 hr tablet Take 25 mg by mouth daily. 09/13/19   [provider]  Multiple Vitamin (MULTIVITAMIN WITH MINERALS) TABS Take 1 tablet by mouth daily.    [provider]  triamcinolone cream (KENALOG) 0.1 % Apply 1 application topically 2 (two) times daily. 12/15/20   Vanessa Kick, MD      Allergies    Amoxicillin and Sulfonamide derivatives    Review of Systems   Review of Systems  Physical Exam Updated Vital Signs BP (!) 184/76   Pulse 72   Temp 98.2 F (36.8 C) (Oral)   Resp 20   Ht 1.524 m (5')   Wt 69.2 kg   SpO2 96%   BMI 29.79 kg/m  Physical Exam Vitals and nursing note reviewed.  Constitutional:      General: She is not in acute distress.    Appearance: She is well-developed.  HENT:     Head: Normocephalic and atraumatic.     Right Ear: External ear normal.     Left Ear: External ear normal.  Eyes:     General: No scleral icterus.       Right eye: No discharge.        Left eye: No discharge.     Conjunctiva/sclera: Conjunctivae normal.  Neck:     Trachea: No tracheal deviation.  Cardiovascular:     Rate and Rhythm:  Normal rate and regular rhythm.  Pulmonary:     Effort: Pulmonary effort is normal. No respiratory distress.     Breath sounds: Normal breath sounds. No stridor. No wheezing or rales.  Abdominal:     General: Bowel sounds are normal. There is no distension.     Palpations: Abdomen is soft.     Tenderness: There is no abdominal tenderness. There is no guarding or rebound.  Musculoskeletal:        General: No tenderness or deformity.     Cervical back: Neck supple.  Skin:    General: Skin is warm and dry.     Findings: No rash.  Neurological:     General: No focal deficit present.     Mental Status: She is alert.     Cranial Nerves: No cranial nerve deficit (no facial droop, extraocular movements intact, no slurred speech).     Sensory: No  sensory deficit.     Motor: No abnormal muscle tone or seizure activity.  Psychiatric:        Mood and Affect: Mood normal.     ED Results / Procedures / Treatments   Labs (all labs ordered are listed, but only abnormal results are displayed) Labs Reviewed  COMPREHENSIVE METABOLIC PANEL - Abnormal; Notable for the following components:      Result Value   Sodium 132 (*)    Chloride 96 (*)    Glucose, Bld 135 (*)    All other components within normal limits  I-STAT CHEM 8, ED - Abnormal; Notable for the following components:   Sodium 131 (*)    Chloride 97 (*)    Glucose, Bld 132 (*)    Calcium, Ion 1.08 (*)    Hemoglobin 15.3 (*)    All other components within normal limits  PROTIME-INR  APTT  CBC  DIFFERENTIAL  ETHANOL  CBG MONITORING, ED  TROPONIN I (HIGH SENSITIVITY)  TROPONIN I (HIGH SENSITIVITY)    EKG EKG Interpretation  Date/Time:  Wednesday June 25 2022 09:42:39 EST Ventricular Rate:  94 PR Interval:  152 QRS Duration: 86 QT Interval:  338 QTC Calculation: 423 R Axis:   47 Text Interpretation: Sinus rhythm Since last tracing rate faster Confirmed by Dorie Rank [phone]) on 06/25/2022 10:00:03 AM  Radiology CT HEAD CODE STROKE WO CONTRAST  Result Date: 06/25/2022 CLINICAL DATA:  Code stroke.  Generalized weakness.  Blurred vision. EXAM: CT HEAD WITHOUT CONTRAST TECHNIQUE: Contiguous axial images were obtained from the base of the skull through the vertex without intravenous contrast. RADIATION DOSE REDUCTION: This exam was performed according to the departmental dose-optimization program which includes automated exposure control, adjustment of the mA and/or kV according to patient size and/or use of iterative reconstruction technique. COMPARISON:  None Available. FINDINGS: Brain: No evidence of acute infarction, hemorrhage, hydrocephalus, extra-axial collection or mass lesion/mass effect. Vascular: No hyperdense vessel or unexpected calcification. Skull:  Normal. Negative for fracture or focal lesion. Sinuses/Orbits: No acute finding. Other: None. ASPECTS [facility] Stroke Program Early CT Score): 10 IMPRESSION: No hemorrhage or CT evidence of an acute infarct. Findings were communicated to Dr. Lorrin Goodell on 06/25/22 at 9:45 AM. Electronically Signed   By: Marin Roberts M.D.   On: 06/25/2022 09:45    Procedures Procedures    Medications Ordered in ED Medications  metoprolol succinate (TOPROL-XL) 24 hr tablet 25 mg (25 mg Oral Given 06/25/22 1006)  LORazepam (ATIVAN) injection 0.5 mg (0.5 mg Intravenous Given 06/25/22 1235)  aspirin EC tablet 81 mg (  has no administration in time range)  clopidogrel (PLAVIX) tablet 75 mg (has no administration in time range)  sodium chloride flush (NS) 0.9 % injection 3 mL (3 mLs Intravenous Given 06/25/22 0930)    ED Course/ Medical Decision Making/ A&P Clinical Course as of 06/25/22 1307  Wed Jun 25, 2022  0954 CBC Normal [JK]  0954 I-stat chem 8, ED(!) Hyponatremia noted, doubt clinically significant [JK]  0955 Head CT without acute findings [JK]  1125 Troponin I (High Sensitivity) Troponin normal [JK]  1221 Case discussed with hospitalist service, Erin Hearing [JK]    Clinical Course User Index [JK] Dorie Rank, MD                           Medical Decision Making Differential diagnosis includes but not limited to, TIA, stroke, hypertensive emergency, near syncope  Problems Addressed: Poorly-controlled hypertension: undiagnosed new problem with uncertain prognosis TIA (transient ischemic attack): acute illness or injury that poses a threat to life or bodily functions  Amount and/or Complexity of Data Reviewed Labs: ordered. Decision-making details documented in ED Course. Radiology: ordered and independent interpretation performed.  Risk Prescription drug management. Decision regarding hospitalization.   Patient presented to the ED with concerns of possible TIA stroke.  In the ED the  patient's symptoms have improved.  Patient was seen by stroke team and Dr. Collecting on arrival.  Symptoms are improving.  Patient not felt to be a tPA or intervention candidate.  Patient noted to be hypertensive here.  We will hold off on aggressive blood pressure management with concerns of stroke TIA.  We will give an oral dose of metoprolol as patient is also complaining of some chest pressure.  We will plan on serial troponins.  I will consult the medical service for admission and further work-up of possible TIA near syncope hypertensive urgency.        Final Clinical Impression(s) / ED Diagnoses Final diagnoses:  TIA (transient ischemic attack)  Poorly-controlled hypertension    Rx / DC Orders ED Discharge Orders     None         Dorie Rank, MD 06/25/22 1307

## 2022-06-25 NOTE — ED Notes (Signed)
Pt provided urine sample. Sent UA and Culture to the lab.

## 2022-06-25 NOTE — ED Notes (Signed)
Pt ambulatory to restroom and back with family assistance

## 2022-06-25 NOTE — ED Notes (Signed)
Pt in MRI.

## 2022-06-25 NOTE — ED Notes (Addendum)
Per neurology can continue doing neuro checks q4 hours -  Keep BP <180

## 2022-06-25 NOTE — ED Notes (Signed)
NP Ebony Hail notified via face to face.

## 2022-06-26 ENCOUNTER — Observation Stay (HOSPITAL_BASED_OUTPATIENT_CLINIC_OR_DEPARTMENT_OTHER): Payer: Medicare Other

## 2022-06-26 ENCOUNTER — Telehealth: Payer: Self-pay | Admitting: *Deleted

## 2022-06-26 DIAGNOSIS — G459 Transient cerebral ischemic attack, unspecified: Secondary | ICD-10-CM | POA: Diagnosis present

## 2022-06-26 DIAGNOSIS — I674 Hypertensive encephalopathy: Secondary | ICD-10-CM | POA: Diagnosis not present

## 2022-06-26 DIAGNOSIS — I10 Essential (primary) hypertension: Secondary | ICD-10-CM | POA: Diagnosis not present

## 2022-06-26 LAB — ECHOCARDIOGRAM COMPLETE
Area-P 1/2: 2.01 cm2
Calc EF: 62.9 %
Height: 60 in
S' Lateral: 2.1 cm
Single Plane A2C EF: 61.1 %
Single Plane A4C EF: 65.5 %
Weight: 2440.93 oz

## 2022-06-26 LAB — BASIC METABOLIC PANEL
Anion gap: 8 (ref 5–15)
BUN: 15 mg/dL (ref 8–23)
CO2: 26 mmol/L (ref 22–32)
Calcium: 9 mg/dL (ref 8.9–10.3)
Chloride: 99 mmol/L (ref 98–111)
Creatinine, Ser: 0.89 mg/dL (ref 0.44–1.00)
GFR, Estimated: >60 mL/min (ref >60)
Glucose, Bld: 94 mg/dL (ref 70–99)
Potassium: 4.1 mmol/L (ref 3.5–5.1)
Sodium: 133 mmol/L — ABNORMAL LOW (ref 135–145)

## 2022-06-26 LAB — LIPID PANEL
Cholesterol: 209 mg/dL — ABNORMAL HIGH (ref 0–200)
HDL: 63 mg/dL (ref >40)
LDL Cholesterol: 127 mg/dL — ABNORMAL HIGH (ref 0–99)
Total CHOL/HDL Ratio: 3.3 RATIO
Triglycerides: 97 mg/dL (ref <150)
VLDL: 19 mg/dL (ref 0–40)

## 2022-06-26 LAB — HEMOGLOBIN A1C
Hgb A1c MFr Bld: 5.5 % (ref 4.8–5.6)
Mean Plasma Glucose: 111.15 mg/dL

## 2022-06-26 LAB — CBC
HCT: 40.5 % (ref 36.0–46.0)
Hemoglobin: 14.2 g/dL (ref 12.0–15.0)
MCH: 31.3 pg (ref 26.0–34.0)
MCHC: 35.1 g/dL (ref 30.0–36.0)
MCV: 89.4 fL (ref 80.0–100.0)
Platelets: 346 10*3/uL (ref 150–400)
RBC: 4.53 MIL/uL (ref 3.87–5.11)
RDW: 13.2 % (ref 11.5–15.5)
WBC: 5.9 10*3/uL (ref 4.0–10.5)
nRBC: 0 % (ref 0.0–0.2)

## 2022-06-26 MED ORDER — ROSUVASTATIN CALCIUM 20 MG PO TABS
20.0000 mg | ORAL_TABLET | Freq: Every day | ORAL | Status: DC
  Administered 2022-06-26: 20 mg via ORAL
  Filled 2022-06-26: qty 1

## 2022-06-26 MED ORDER — CLOPIDOGREL BISULFATE 75 MG PO TABS: 75.0000 mg | ORAL_TABLET | Freq: Every day | ORAL | 0 refills | Status: DC

## 2022-06-26 MED ORDER — ASPIRIN 81 MG PO TBEC: 81.0000 mg | DELAYED_RELEASE_TABLET | Freq: Every day | ORAL | 12 refills | Status: AC

## 2022-06-26 MED ORDER — ROSUVASTATIN CALCIUM 20 MG PO TABS: 20.0000 mg | ORAL_TABLET | Freq: Every day | ORAL | 1 refills | Status: AC

## 2022-06-26 MED ORDER — DILTIAZEM HCL 30 MG PO TABS: 30.0000 mg | ORAL_TABLET | Freq: Three times a day (TID) | ORAL | 1 refills | Status: DC

## 2022-06-26 NOTE — Progress Notes (Signed)
Echocardiogram 2D Echocardiogram has been performed.  Candace Stewart 06/26/2022, 9:51 AM

## 2022-06-26 NOTE — Progress Notes (Addendum)
STROKE TEAM PROGRESS NOTE   INTERVAL HISTORY Patient is seen in her room with one family member at the bedside.  Yesterday, she had a transient episode of slurred and garbled speech along with feeling as if she was going to pass out.  This resolved spontaneously after 20 minutes.  EMS was called, and patient was noted to be quite hypertensive with SBP in the 220s.   Vitals:   06/26/22 0530 06/26/22 0700 06/26/22 0800 06/26/22 0900  BP: 135/67 135/82 (!) 140/48 (!) 153/70  Pulse: 67 63 73 68  Resp: '17 18 18 '$ (!) 22  Temp: 98.5 F (36.9 C)     TempSrc: Oral     SpO2: 93% 94% 93% 95%  Weight:      Height:       CBC:  Recent Labs  Lab 06/25/22 0928 06/25/22 0934 06/25/22 1439 06/26/22 0500  WBC 8.7  --  8.1 5.9  NEUTROABS 5.0  --   --   --   HGB 14.3   < > 12.8 14.2  HCT 42.0   < > 39.5 40.5  MCV 90.9  --  93.8 89.4  PLT 357  --  346 346   < > = values in this interval not displayed.   Basic Metabolic Panel:  Recent Labs  Lab 06/25/22 0928 06/25/22 0934 06/25/22 1439 06/26/22 0500  NA 132* 131*  --  133*  K 3.8 3.8  --  4.1  CL 96* 97*  --  99  CO2 24  --   --  26  GLUCOSE 135* 132*  --  94  BUN 18 19  --  15  CREATININE 0.79 0.70 0.61 0.89  CALCIUM 9.2  --   --  9.0   Lipid Panel:  Recent Labs  Lab 06/26/22 0500  CHOL 209*  TRIG 97  HDL 63  CHOLHDL 3.3  VLDL 19  LDLCALC 127*   HgbA1c:  Recent Labs  Lab 06/26/22 0500  HGBA1C 5.5   Urine Drug Screen: No results for input(s): "LABOPIA", "COCAINSCRNUR", "LABBENZ", "AMPHETMU", "THCU", "LABBARB" in the last 168 hours.  Alcohol Level  Recent Labs  Lab 06/25/22 0931  ETH <10    IMAGING past 24 hours EEG adult  Result Date: 06/25/2022 Lora Havens, MD     06/25/2022  9:25 PM Patient Name: Candace Stewart MRN: [id number] Epilepsy Attending: Lora Havens Referring Physician/Provider: Donnetta Simpers, MD Date: 06/25/2022 Duration: 22.35 mins Patient history: 81 y.o. female presents with more  than a week history of unsteady gait. This morning around 0800, had an episode of trouble reading words on TV, garbled speech when talking to her daughter over phone but not particularly confused and felt like she was going to pass out with blurred and tunneling of her vision. Symptoms lasted about 20 mins and resolved by the time she got here. EEG to evaluate for seizure. Level of alertness: Awake, asleep AEDs during EEG study: Ativan Technical aspects: This EEG study was done with scalp electrodes positioned according to the 10-20 International system of electrode placement. Electrical activity was reviewed with band pass filter of 1-'70Hz'$ , sensitivity of 7 uV/mm, display speed of 58m/sec with a '60Hz'$  notched filter applied as appropriate. EEG data were recorded continuously and digitally stored.  Video monitoring was available and reviewed as appropriate. Description: The posterior dominant rhythm consists of 9-10 Hz activity of moderate voltage (25-35 uV) seen predominantly in posterior head regions, symmetric and reactive to eye opening and eye  closing. Sleep was characterized by vertex waves, sleep spindles (12 to 14 Hz), maximal frontocentral region. Hyperventilation and photic stimulation were not performed.   IMPRESSION: This study is within normal limits. No seizures or epileptiform discharges were seen throughout the recording. A normal interictal EEG does not exclude the diagnosis of epilepsy. Lora Havens   CT ANGIO HEAD NECK W WO CM  Result Date: 06/25/2022 CLINICAL DATA:  Transient ischemic attack. EXAM: CT ANGIOGRAPHY HEAD AND NECK TECHNIQUE: Multidetector CT imaging of the head and neck was performed using the standard protocol during bolus administration of intravenous contrast. Multiplanar CT image reconstructions and MIPs were obtained to evaluate the vascular anatomy. Carotid stenosis measurements (when applicable) are obtained utilizing NASCET criteria, using the distal internal carotid  diameter as the denominator. RADIATION DOSE REDUCTION: This exam was performed according to the departmental dose-optimization program which includes automated exposure control, adjustment of the mA and/or kV according to patient size and/or use of iterative reconstruction technique. CONTRAST:  27m OMNIPAQUE IOHEXOL 350 MG/ML SOLN COMPARISON:  CT and MRI of the brain June 25, 2022 FINDINGS: CTA NECK FINDINGS Aortic arch: Standard branching. Imaged portion shows no evidence of aneurysm or dissection. Mild atherosclerotic changes of the aortic arch. No significant stenosis of the major arch vessel origins. Right carotid system: Minimal atherosclerotic changes of the right carotid bifurcation without hemodynamically significant stenosis. Left carotid system: No evidence of dissection, stenosis (50% or greater), or occlusion. Vertebral arteries: Codominant. No evidence of dissection, stenosis (50% or greater), or occlusion. Skeleton: Cervical spondylosis.  No acute or aggressive process. Other neck: Negative. Upper chest: Negative. Review of the MIP images confirms the above findings CTA HEAD FINDINGS Anterior circulation: Calcified plaques in the bilateral carotid siphons without hemodynamically significant stenosis. Mild luminal irregularity in the M2 segments of the bilateral MCA without hemodynamically significant stenosis. The bilateral ACA vascular trees are maintained. Posterior circulation: Normal caliber of the intracranial vertebral arteries and basilar arteries. Mild luminal irregularity of the bilateral posterior cerebral arteries focal area of moderate stenosis at the right P2/PCA segment. Venous sinuses: No significant contrast opacification of the venous sinuses. Anatomic variants: Hypoplastic left A1/ACA segment. Review of the MIP images confirms the above findings IMPRESSION: 1. No large vessel occlusion. 2. Intracranial atherosclerotic disease with focal area of moderate stenosis at the right  P2/PCA segment. 3. No hemodynamically significant stenosis of the neck. 4. Aortic atherosclerosis. Aortic Atherosclerosis (ICD10-I70.0). Electronically Signed   By: KPedro EarlsM.D.   On: 06/25/2022 14:26   MR BRAIN WO CONTRAST  Result Date: 06/25/2022 CLINICAL DATA:  Neuro deficit, acute, stroke suspected. EXAM: MRI HEAD WITHOUT CONTRAST TECHNIQUE: Multiplanar, multiecho pulse sequences of the brain and surrounding structures were obtained without intravenous contrast. COMPARISON:  Head CT June 25, 2022. FINDINGS: Brain: No acute infarction, hemorrhage, hydrocephalus, extra-axial collection or mass lesion. Scattered foci of T2 hyperintensity are seen within the white matter of the cerebral hemispheres, nonspecific, most likely related to chronic small vessel ischemia. Mild parenchymal volume loss. Vascular: Normal flow voids. Skull and upper cervical spine: Normal marrow signal. Sinuses/Orbits: Trace mucosal thickening in the ethmoid cells. The orbits are maintained Other: None. IMPRESSION: 1. No acute intracranial abnormality. 2. Mild chronic microvascular ischemic changes of the white matter. Electronically Signed   By: KPedro EarlsM.D.   On: 06/25/2022 13:22    PHYSICAL EXAM General:  Alert, well-nourished, well-developed patient in no acute distress Respiratory:  Regular, unlabored respirations on room air  NEURO:  Mental Status: AA&Ox3  Speech/Language: speech is without dysarthria or aphasia.  Naming, repetition, fluency, and comprehension intact.  Cranial Nerves:  II: PERRL. Visual fields full.  III, IV, VI: EOMI. Eyelids elevate symmetrically.  V: Sensation is intact to light touch and symmetrical to face.  VII: Smile is symmetrical. Able to puff cheeks and raise eyebrows.  VIII: hearing intact to voice. IX, X: Phonation is normal.  BM:[id number] shrug 5/5. XII: tongue is midline without fasciculations. Motor: 5/5 strength to all muscle groups  tested.  Tone: is normal and bulk is normal Sensation- Intact to light touch bilaterally.  Gait- deferred   ASSESSMENT/PLAN Ms. Candace Stewart is a 81 y.o. female with history of depression, GERD, HLD and  osteoporosis presenting with a transient episode of slurred and garbled speech along with feeling as if she was going to pass out.  This resolved spontaneously after 20 minutes.  EMS was called, and patient was noted to be quite hypertensive with SBP in the 220s.   Hypertensive encephalopathy vs. TIA Code Stroke CT head No acute abnormality.  ASPECTS 10.    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 MRI  No acute abnormality, mild chronic microvascular ischemic changes 2D Echo pending LDL 127 HgbA1c 5.5 VTE prophylaxis - lovenox No antithrombotic prior to admission, now on aspirin 81 mg daily and clopidogrel 75 mg daily for three weeks, then aspirin alone Therapy recommendations:  outpatient PT Disposition:  home  Hypertensive urgency Home meds:  metoprolol XL 25 mg daily BP significantly elevated on presentation Stable now Need close BP monitoring at home Long-term BP goal normotensive  Hyperlipidemia Home meds:  none LDL 127, goal < 70 Add rosuvastatin 20 mg daily  Continue statin at discharge  Other Stroke Risk Factors Advanced Age >/= 41  Former cigarette smoker  Other Active Problems none  Hospital day # 0  [place] , MSN, AGACNP-BC [facility] See Amion for schedule and pager information 06/26/2022 10:52 AM  ATTENDING NOTE: I reviewed above note and agree with the assessment and plan.   81 year old female with history of hypertension hyperlipidemia admitted for gait imbalance for a week, vision changes and slurred speech for 20 minutes.  BP significant elevated on presentation.  CT no acute abnormality.  MRI no acute infarct.  CT head and neck showed right P2 moderate stenosis.  2D echo pending.  EEG normal.  LDL 127, A1c  5.5.  Creatinine 0.89.  Per report, patient currently no symptoms.  Etiology of patient's symptoms not quite clear, concerning for hypertensive encephalopathy in the setting of significant elevated BP.  TIA is also a possibility.  Currently BP much improved.  Will treat with DAPT for 3 weeks and then aspirin alone.  At statin for HLD.  Educated on BP home monitoring, and hypertension management.  Close PCP follow-up.  PT/OT recommendation.  We will follow-up at [facility].  For detailed assessment and plan, please refer to above/below as I have made changes wherever appropriate.   Neurology will sign off. Please call with questions. Pt will follow up with stroke clinic NP at [facility] in about 4 weeks. Thanks for the consult.   Rosalin Hawking, MD PhD Stroke Neurology 06/26/2022 11:06 AM      To contact Stroke Continuity provider, please refer to [email or web address] After hours, contact General Neurology

## 2022-06-26 NOTE — ED Notes (Signed)
Pt ambulating to bathroom with family

## 2022-06-26 NOTE — Evaluation (Signed)
Occupational Therapy Evaluation/Discharge Patient Details Name: Candace Stewart MRN: [id number] DOB: [birth year] Today's Date: 06/26/2022   History of Present Illness Pt is an 81 y/o female who presented with L sided weakness and word finding difficulty. Pt hypertensive on arrival. MRI brain negative. PMH: HTN, cardiac cath, GERD, anxiety.   Clinical Impression   PTA, pt lives with daughter, Independent with ADLs, IADLs, driving and mobility without AD. Pt denies any recent falls. Pt presents now at baseline for ADLs/mobility, able to mobilize around unit without safety concerns. Pt does endorse baseline balance deficits when turning too fast but a proficient in symptom monitoring and implementing safety precautions. BP WFL pre and post activity. Encouraged pt to check BP daily at home and prior to taking BP meds. No further skilled OT services needed at acute level. Pt functionally appropriate for DC home once medically cleared.       Recommendations for follow up therapy are one component of a multi-disciplinary discharge planning process, led by the attending physician.  Recommendations may be updated based on patient status, additional functional criteria and insurance authorization.   Follow Up Recommendations  No OT follow up     Assistance Recommended at Discharge PRN  Patient can return home with the following      Functional Status Assessment  Patient has not had a recent decline in their functional status  Equipment Recommendations  None recommended by OT    Recommendations for Other Services       Precautions / Restrictions Precautions Precautions: None Restrictions Weight Bearing Restrictions: No      Mobility Bed Mobility Overal bed mobility: Modified Independent                  Transfers Overall transfer level: Independent Equipment used: None                      Balance Overall balance assessment: Mild deficits observed, not formally  tested                                         ADL either performed or assessed with clinical judgement   ADL Overall ADL's : Independent;At baseline                                       General ADL Comments: able to don gown around back in standing, mobilze around unit without AD. Pt endorses baseline balance deficits if turning too quickly though no overt LOB noted at this time.Good awareness of deficits and safety precautions. educated on monitoring BP at home, checking BP prior to taking meds     Vision Baseline Vision/History: 1 Wears glasses Ability to See in Adequate Light: 0 Adequate Patient Visual Report: No change from baseline Vision Assessment?: No apparent visual deficits     Perception     Praxis      Pertinent Vitals/Pain Pain Assessment Pain Assessment: No/denies pain     Hand Dominance Right   Extremity/Trunk Assessment Upper Extremity Assessment Upper Extremity Assessment: Overall WFL for tasks assessed   Lower Extremity Assessment Lower Extremity Assessment: Overall WFL for tasks assessed   Cervical / Trunk Assessment Cervical / Trunk Assessment: Normal   Communication Communication Communication: No difficulties   Cognition Arousal/Alertness: Awake/alert Behavior During Therapy: WFL for  tasks assessed/performed Overall Cognitive Status: Within Functional Limits for tasks assessed                                       General Comments  Daughter present    Exercises     Shoulder Instructions      Home Living Family/patient expects to be discharged to:: Private residence Living Arrangements: Children Available Help at Discharge: Family;Available PRN/intermittently (daughter works) Type of Home: House Home Access: Stairs to enter Technical brewer of Steps: 3-4 Entrance Stairs-Rails: None Home Layout: Two level (steps down into [employer] area. pt does access the den) Alternate  Level Stairs-Number of Steps: 12 Alternate Level Stairs-Rails: Right           Home Equipment: None          Prior Functioning/Environment Prior Level of Function : Independent/Modified Independent;Driving             Mobility Comments: no use of AD, no falls ADLs Comments: Drives, active in church and community        OT Problem List: Impaired balance (sitting and/or standing)      OT Treatment/Interventions:      OT Goals(Current goals can be found in the care plan section) Acute Rehab OT Goals Patient Stated Goal: home today OT Goal Formulation: All assessment and education complete, DC therapy  OT Frequency:      Co-evaluation              AM-PAC OT "6 Clicks" Daily Activity     Outcome Measure Help from another person eating meals?: None Help from another person taking care of personal grooming?: None Help from another person toileting, which includes using toliet, bedpan, or urinal?: None Help from another person bathing (including washing, rinsing, drying)?: None Help from another person to put on and taking off regular upper body clothing?: None Help from another person to put on and taking off regular lower body clothing?: None 6 Click Score: 24   End of Session Nurse Communication: Mobility status  Activity Tolerance: Patient tolerated treatment well Patient left: in bed;with call bell/phone within reach;with family/visitor present  OT Visit Diagnosis: Unsteadiness on feet (R26.81)                Time: 2440-1027 OT Time Calculation (min): 16 min Charges:  OT General Charges $OT Visit: 1 Visit OT Evaluation $OT Eval Low Complexity: 1 Low  Malachy Chamber, OTR/L Acute Rehab Services Office: [phone]   Layla Maw 06/26/2022, 8:48 AM

## 2022-06-26 NOTE — ED Notes (Signed)
Discharge instructions discussed with patient and family. Medications reviewed. No questions at this time. VS within normal limits.

## 2022-06-26 NOTE — ED Notes (Signed)
Pt placed on hospital bed

## 2022-06-26 NOTE — Discharge Summary (Signed)
Physician Discharge Summary  Candace Stewart IPJ:[id number] DOB: Feb 20, 1941 DOA: 06/25/2022  PCP: Jani Gravel, MD  Admit date: 06/25/2022 Discharge date: 06/26/2022  Admitted From: Home Disposition: Home  Recommendations for Outpatient Follow-up:  Follow up with PCP in 1-2 weeks  Home Health: Outpatient PT Equipment/Devices: None  Discharge Condition: Stable CODE STATUS: Full Diet recommendation: Low-salt low-fat diet  Brief/Interim Summary: Candace Stewart is a 81 y.o. 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    Patient admitted for strokelike symptoms yesterday morning, admitted to hospitalist service with neurology consult, imaging unremarkable for acute findings, patient back to baseline at this time.  Likely etiology is TIA at this point we will discharge patient on aspirin Plavix and statin per neurology recommendations with close outpatient follow-up.  EEG performed also unremarkable.  Patient's blood pressure was markedly uncontrolled at intake but improving now on current regimen.  Discussed need for medication compliance, improve diet and lifestyle decisions as well as close follow-up with PCP in the next 1 to 2 weeks.  Discharge Diagnoses:  Principal Problem:   TIA (transient ischemic attack) Active Problems:   GERD   Anxiety   Hyperlipidemia   Hypertension   Pure hypercholesterolemia   Hypertensive urgency    Discharge Instructions  Discharge Instructions     Call MD for:   Complete by: As directed    Weakness/numbness   Call MD for:  difficulty breathing, headache or visual disturbances   Complete by: As directed    Call MD for:  extreme fatigue   Complete by: As directed    Call MD for:  persistant dizziness or light-headedness   Complete by: As directed    Diet - low sodium heart  healthy   Complete by: As directed    Discharge instructions   Complete by: As directed    Follow up with PCP in the next week to follow up on BP/medications as discussed.   Increase activity slowly   Complete by: As directed       Allergies as of 06/26/2022       Reactions   Amoxicillin Diarrhea   GI upset intolerance   Sulfonamide Derivatives Other (See Comments)   Sulfa Antibiotics Substance With Sulfonamide Structure And Antibacterial Mechanism Of Action (Substance)        Medication List     STOP taking these medications    Calcium 600+D Plus Minerals 600-400 MG-UNIT Tabs   meloxicam 15 MG tablet Commonly known as: MOBIC       TAKE these medications    acetaminophen 325 MG tablet Commonly known as: TYLENOL Take 650 mg by mouth every 6 (six) hours as needed.   aspirin EC 81 MG tablet Take 1 tablet (81 mg total) by mouth daily. Swallow whole.   Calcium Carbonate-Vitamin D3 600-400 MG-UNIT Tabs Take by mouth 2 (two) times daily.   clopidogrel 75 MG tablet Commonly known as: PLAVIX Take 1 tablet (75 mg total) by mouth daily.   diltiazem 30 MG tablet Commonly known as: CARDIZEM Take 1 tablet (30 mg total) by mouth every 8 (eight) hours.   esomeprazole 40 MG capsule Commonly known as: NEXIUM Take 40 mg by mouth every evening.   LORazepam 0.5 MG tablet Commonly known as: ATIVAN Take 0.5 mg by mouth every 8 (eight) hours as needed for anxiety.   metoprolol succinate 25 MG  24 hr tablet Commonly known as: TOPROL-XL Take 25 mg by mouth daily.   multivitamin with minerals Tabs tablet Take 1 tablet by mouth daily.   rosuvastatin 20 MG tablet Commonly known as: CRESTOR Take 1 tablet (20 mg total) by mouth daily.        Allergies  Allergen Reactions   Amoxicillin Diarrhea    GI upset intolerance   Sulfonamide Derivatives Other (See Comments)    Sulfa Antibiotics Substance With Sulfonamide Structure And Antibacterial Mechanism Of Action  (Substance)      Consultations: Neurology   Procedures/Studies: EEG adult  Result Date: 07-22-22 Lora Havens, MD     July 22, 2022  9:25 PM Patient Name: Candace Stewart MRN: [id number] Epilepsy Attending: Lora Havens Referring Physician/Provider: Donnetta Simpers, MD Date: 2022/07/22 Duration: 22.35 mins Patient history: 81 y.o. female presents with more than a week history of unsteady gait. This morning around 0800, had an episode of trouble reading words on TV, garbled speech when talking to her daughter over phone but not particularly confused and felt like she was going to pass out with blurred and tunneling of her vision. Symptoms lasted about 20 mins and resolved by the time she got here. EEG to evaluate for seizure. Level of alertness: Awake, asleep AEDs during EEG study: Ativan Technical aspects: This EEG study was done with scalp electrodes positioned according to the 10-20 International system of electrode placement. Electrical activity was reviewed with band pass filter of 1-'70Hz'$ , sensitivity of 7 uV/mm, display speed of 49m/sec with a '60Hz'$  notched filter applied as appropriate. EEG data were recorded continuously and digitally stored.  Video monitoring was available and reviewed as appropriate. Description: The posterior dominant rhythm consists of 9-10 Hz activity of moderate voltage (25-35 uV) seen predominantly in posterior head regions, symmetric and reactive to eye opening and eye closing. Sleep was characterized by vertex waves, sleep spindles (12 to 14 Hz), maximal frontocentral region. Hyperventilation and photic stimulation were not performed.   IMPRESSION: This study is within normal limits. No seizures or epileptiform discharges were seen throughout the recording. A normal interictal EEG does not exclude the diagnosis of epilepsy. PLora Havens  CT ANGIO HEAD NECK W WO CM  Result Date: 1December 12, 2023CLINICAL DATA:  Transient ischemic attack. EXAM: CT ANGIOGRAPHY  HEAD AND NECK TECHNIQUE: Multidetector CT imaging of the head and neck was performed using the standard protocol during bolus administration of intravenous contrast. Multiplanar CT image reconstructions and MIPs were obtained to evaluate the vascular anatomy. Carotid stenosis measurements (when applicable) are obtained utilizing NASCET criteria, using the distal internal carotid diameter as the denominator. RADIATION DOSE REDUCTION: This exam was performed according to the departmental dose-optimization program which includes automated exposure control, adjustment of the mA and/or kV according to patient size and/or use of iterative reconstruction technique. CONTRAST:  715mOMNIPAQUE IOHEXOL 350 MG/ML SOLN COMPARISON:  CT and MRI of the brain NoDec 12, 2023INDINGS: CTA NECK FINDINGS Aortic arch: Standard branching. Imaged portion shows no evidence of aneurysm or dissection. Mild atherosclerotic changes of the aortic arch. No significant stenosis of the major arch vessel origins. Right carotid system: Minimal atherosclerotic changes of the right carotid bifurcation without hemodynamically significant stenosis. Left carotid system: No evidence of dissection, stenosis (50% or greater), or occlusion. Vertebral arteries: Codominant. No evidence of dissection, stenosis (50% or greater), or occlusion. Skeleton: Cervical spondylosis.  No acute or aggressive process. Other neck: Negative. Upper chest: Negative. Review of the MIP images confirms the  above findings CTA HEAD FINDINGS Anterior circulation: Calcified plaques in the bilateral carotid siphons without hemodynamically significant stenosis. Mild luminal irregularity in the M2 segments of the bilateral MCA without hemodynamically significant stenosis. The bilateral ACA vascular trees are maintained. Posterior circulation: Normal caliber of the intracranial vertebral arteries and basilar arteries. Mild luminal irregularity of the bilateral posterior cerebral  arteries focal area of moderate stenosis at the right P2/PCA segment. Venous sinuses: No significant contrast opacification of the venous sinuses. Anatomic variants: Hypoplastic left A1/ACA segment. Review of the MIP images confirms the above findings IMPRESSION: 1. No large vessel occlusion. 2. 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 3. No hemodynamically significant stenosis of the neck. 4. Aortic atherosclerosis. Aortic Atherosclerosis (ICD10-I70.0). Electronically Signed   By: Pedro Earls M.D.   On: 06/25/2022 14:26   MR BRAIN WO CONTRAST  Result Date: 06/25/2022 CLINICAL DATA:  Neuro deficit, acute, stroke suspected. EXAM: MRI HEAD WITHOUT CONTRAST TECHNIQUE: Multiplanar, multiecho pulse sequences of the brain and surrounding structures were obtained without intravenous contrast. COMPARISON:  Head CT June 25, 2022. FINDINGS: Brain: No acute infarction, hemorrhage, hydrocephalus, extra-axial collection or mass lesion. Scattered foci of T2 hyperintensity are seen within the white matter of the cerebral hemispheres, nonspecific, most likely related to chronic small vessel ischemia. Mild parenchymal volume loss. Vascular: Normal flow voids. Skull and upper cervical spine: Normal marrow signal. Sinuses/Orbits: Trace mucosal thickening in the ethmoid cells. The orbits are maintained Other: None. IMPRESSION: 1. No acute intracranial abnormality. 2. Mild chronic microvascular ischemic changes of the white matter. Electronically Signed   By: Pedro Earls M.D.   On: 06/25/2022 13:22   CT HEAD CODE STROKE WO CONTRAST  Result Date: 06/25/2022 CLINICAL DATA:  Code stroke.  Generalized weakness.  Blurred vision. EXAM: CT HEAD WITHOUT CONTRAST TECHNIQUE: Contiguous axial images were obtained from the base of the skull through the vertex without intravenous contrast. RADIATION DOSE REDUCTION: This exam was performed  according to the departmental dose-optimization program which includes automated exposure control, adjustment of the mA and/or kV according to patient size and/or use of iterative reconstruction technique. COMPARISON:  None Available. FINDINGS: Brain: No evidence of acute infarction, hemorrhage, hydrocephalus, extra-axial collection or mass lesion/mass effect. Vascular: No hyperdense vessel or unexpected calcification. Skull: Normal. Negative for fracture or focal lesion. Sinuses/Orbits: No acute finding. Other: None. ASPECTS [facility] Stroke Program Early CT Score): 10 IMPRESSION: No hemorrhage or CT evidence of an acute infarct. Findings were communicated to Dr. Lorrin Goodell on 06/25/22 at 9:45 AM. Electronically Signed   By: Marin Roberts M.D.   On: 06/25/2022 09:45     Subjective: No acute issues or events overnight denies nausea vomiting diarrhea constipation headache fevers chills or chest pain   Discharge Exam: Vitals:   06/26/22 0900 06/26/22 1045  BP: (!) 153/70 (!) 159/80  Pulse: 68 70  Resp: (!) 22 18  Temp:  98.5 F (36.9 C)  SpO2: 95% 96%   Vitals:   06/26/22 0700 06/26/22 0800 06/26/22 0900 06/26/22 1045  BP: 135/82 (!) 140/48 (!) 153/70 (!) 159/80  Pulse: 63 73 68 70  Resp: 18 18 (!) 22 18  Temp:    98.5 F (36.9 C)  TempSrc:      SpO2: 94% 93% 95% 96%  Weight:      Height:        General: Pt is alert, awake, not in acute distress Cardiovascular: RRR, S1/S2 +, no  rubs, no gallops Respiratory: CTA bilaterally, no wheezing, no rhonchi Abdominal: Soft, NT, ND, bowel sounds + Extremities: no edema, no cyanosis    The results of significant diagnostics from this hospitalization (including imaging, microbiology, ancillary and laboratory) are listed below for reference.     Microbiology: No results found for this or any previous visit (from the past 240 hour(s)).   Labs: BNP (last 3 results) No results for input(s): "BNP" in the last 8760 hours. Basic Metabolic  Panel: Recent Labs  Lab 06/25/22 0928 06/25/22 0934 06/25/22 1439 06/26/22 0500  NA 132* 131*  --  133*  K 3.8 3.8  --  4.1  CL 96* 97*  --  99  CO2 24  --   --  26  GLUCOSE 135* 132*  --  94  BUN 18 19  --  15  CREATININE 0.79 0.70 0.61 0.89  CALCIUM 9.2  --   --  9.0   Liver Function Tests: Recent Labs  Lab 06/25/22 0928  AST 21  ALT 16  ALKPHOS 76  BILITOT 0.4  PROT 7.0  ALBUMIN 3.8   No results for input(s): "LIPASE", "AMYLASE" in the last 168 hours. No results for input(s): "AMMONIA" in the last 168 hours. CBC: Recent Labs  Lab 06/25/22 0928 06/25/22 0934 06/25/22 1439 06/26/22 0500  WBC 8.7  --  8.1 5.9  NEUTROABS 5.0  --   --   --   HGB 14.3 15.3* 12.8 14.2  HCT 42.0 45.0 39.5 40.5  MCV 90.9  --  93.8 89.4  PLT 357  --  346 346   Cardiac Enzymes: No results for input(s): "CKTOTAL", "CKMB", "CKMBINDEX", "TROPONINI" in the last 168 hours. BNP: Invalid input(s): "POCBNP" CBG: Recent Labs  Lab 06/25/22 0928  GLUCAP 92   D-Dimer No results for input(s): "DDIMER" in the last 72 hours. Hgb A1c Recent Labs    06/26/22 0500  HGBA1C 5.5   Lipid Profile Recent Labs    06/26/22 0500  CHOL 209*  HDL 63  LDLCALC 127*  TRIG 97  CHOLHDL 3.3   Thyroid function studies Recent Labs    06/25/22 1443  TSH 1.247   Anemia work up No results for input(s): "VITAMINB12", "FOLATE", "FERRITIN", "TIBC", "IRON", "RETICCTPCT" in the last 72 hours. Urinalysis No results found for: "COLORURINE", "APPEARANCEUR", "LABSPEC", "PHURINE", "GLUCOSEU", "HGBUR", "BILIRUBINUR", "KETONESUR", "PROTEINUR", "UROBILINOGEN", "NITRITE", "LEUKOCYTESUR" Sepsis Labs Recent Labs  Lab 06/25/22 0928 06/25/22 1439 06/26/22 0500  WBC 8.7 8.1 5.9   Microbiology No results found for this or any previous visit (from the past 240 hour(s)).   Time coordinating discharge: Over 30 minutes  SIGNED:   Little Ishikawa, DO [facility] 06/26/2022, 10:49 AM Pager    If 7PM-7AM, please contact night-coverage [email or web address]

## 2022-06-26 NOTE — Telephone Encounter (Signed)
Orders entered for vestibular pt

## 2022-06-26 NOTE — Evaluation (Signed)
Physical Therapy Evaluation Patient Details Name: Candace Stewart MRN: [id number] DOB: [birth year] Today's Date: 06/26/2022  History of Present Illness  Pt is an 81 y/o female who presented with L sided weakness and word finding difficulty. Pt hypertensive on arrival. MRI brain negative. PMH: HTN, cardiac cath, GERD, anxiety.  Clinical Impression  Pt admitted with above diagnosis. Pt was able to ambulate well overall with good balance wihtout device. Can withstand min to mod challenges to balance.  At times, states she is dizzy with turns and head mvoements.  Tested pt for Vestibular  issue and pt is positive for right hypofunction. Pt and daughter educated regarding x 1 exercises. REcommend f/u with Outpt PT vestibular program for pt to address balance and vestibular issues and pt and daughter agree.  Also discussed that pt is at risk for falls in uncontrolled environment and she could use device for incr safety when in these environments.  Pt going home today therefore will not follow in hospital.  Pt currently with functional limitations due to the deficits listed below (see PT Problem List).      Recommendations for follow up therapy are one component of a multi-disciplinary discharge planning process, led by the attending physician.  Recommendations may be updated based on patient status, additional functional criteria and insurance authorization.  Follow Up Recommendations Outpatient PT (vestibular rehab)      Assistance Recommended at Discharge Intermittent Supervision/Assistance  Patient can return home with the following  Assist for transportation;Help with stairs or ramp for entrance;Assistance with cooking/housework    Equipment Recommendations None recommended by PT  Recommendations for Other Services       Functional Status Assessment Patient has had a recent decline in their functional status and demonstrates the ability to make significant improvements in function in a  reasonable and predictable amount of time.     Precautions / Restrictions Precautions Precautions: None Restrictions Weight Bearing Restrictions: No      Mobility  Bed Mobility Overal bed mobility: Modified Independent                  Transfers Overall transfer level: Independent Equipment used: None                    Ambulation/Gait Ambulation/Gait assistance: Supervision, Independent Gait Distance (Feet): 500 Feet Assistive device: None Gait Pattern/deviations: Step-through pattern, Decreased stride length   Gait velocity interpretation: 1.31 - 2.62 ft/sec, indicative of limited community ambulator   General Gait Details: No significant LOB even with challenges. 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  Stairs Stairs: Yes Stairs assistance: Min guard Stair Management: One rail Right, Forwards, Step to pattern Number of Stairs: 3 General stair comments: daughter has been helping pt.  Wheelchair Mobility    Modified Rankin (Stroke Patients Only) Modified Rankin (Stroke Patients Only) Pre-Morbid Rankin Score: Slight disability Modified Rankin: Slight disability     Balance Overall balance assessment: Independent                               Standardized Balance Assessment Standardized Balance Assessment : Dynamic Gait Index   Dynamic Gait Index Level Surface: Normal Change in Gait Speed: Normal Gait with Horizontal Head Turns: Mild Impairment Gait with Vertical Head Turns: Mild Impairment Gait and Pivot Turn: Mild  Impairment Step Over Obstacle: Mild Impairment Step Around Obstacles: Normal Steps: Moderate Impairment Total Score: 18       Pertinent Vitals/Pain Pain Assessment Pain Assessment: No/denies pain    Home Living Family/patient expects to be discharged to:: Private  residence Living Arrangements: Children Available Help at Discharge: Family;Available PRN/intermittently (daughter works) Type of Home: House Home Access: Stairs to enter Entrance Stairs-Rails: None Technical brewer of Steps: 3-4 Alternate Level Stairs-Number of Steps: 12 Home Layout: Two level (steps down into [employer] area. pt does access the den) Home Equipment: None      Prior Function Prior Level of Function : Independent/Modified Independent;Driving             Mobility Comments: no use of AD, no falls ADLs Comments: Drives, active in church and community     Hand Dominance   Dominant Hand: Right    Extremity/Trunk Assessment   Upper Extremity Assessment Upper Extremity Assessment: Defer to OT evaluation    Lower Extremity Assessment Lower Extremity Assessment: Overall WFL for tasks assessed    Cervical / Trunk Assessment Cervical / Trunk Assessment: Normal  Communication   Communication: No difficulties  Cognition Arousal/Alertness: Awake/alert Behavior During Therapy: WFL for tasks assessed/performed Overall Cognitive Status: Within Functional Limits for tasks assessed                                          General Comments General comments (skin integrity, edema, etc.): daughter present, issued TIA handout    Exercises Other Exercises Other Exercises: Issued x1 exercise handout to pt and educated pt in how to perform and progress exercises.   Assessment/Plan    PT Assessment All further PT needs can be met in the next venue of care  PT Problem List Decreased balance;Decreased mobility;Other (comment) (vertigo)       PT Treatment Interventions      PT Goals (Current goals can be found in the Care Plan section)  Acute Rehab PT Goals Patient Stated Goal: to go home PT Goal Formulation: All assessment and education complete, DC therapy    Frequency       Co-evaluation               AM-PAC PT "6  Clicks" Mobility  Outcome Measure Help needed turning from your back to your side while in a flat bed without using bedrails?: None Help needed moving from lying on your back to sitting on the side of a flat bed without using bedrails?: None Help needed moving to and from a bed to a chair (including a wheelchair)?: None Help needed standing up from a chair using your arms (e.g., wheelchair or bedside chair)?: None Help needed to walk in hospital room?: A Little Help needed climbing 3-5 steps with a railing? : A Little 6 Click Score: 22    End of Session Equipment Utilized During Treatment: Gait belt Activity Tolerance: Patient tolerated treatment well Patient left: in bed;with call bell/phone within reach;with family/visitor present Nurse Communication: Mobility status PT Visit Diagnosis: Dizziness and giddiness (R42);Other abnormalities of gait and mobility (R26.89)    Time: 4431-5400 PT Time Calculation (min) (ACUTE ONLY): 45 min   Charges:   PT Evaluation $PT Eval Moderate Complexity: 1 Mod PT Treatments $Gait Training: 8-22 mins $Therapeutic Exercise: 8-22 mins        Thedford Bunton M,PT Acute [employer] [phone]   [facility]  F Nikiya Starn 06/26/2022, 11:49 AM

## 2022-06-28 ENCOUNTER — Emergency Department (HOSPITAL_COMMUNITY): Payer: Medicare Other

## 2022-06-28 ENCOUNTER — Other Ambulatory Visit: Payer: Self-pay

## 2022-06-28 ENCOUNTER — Emergency Department (HOSPITAL_COMMUNITY)
Admission: EM | Admit: 2022-06-28 | Discharge: 2022-06-28 | Disposition: A | Discharge status: Home / Self Care | Payer: Medicare Other | Attending: Emergency Medicine | Admitting: Emergency Medicine

## 2022-06-28 DIAGNOSIS — I10 Essential (primary) hypertension: Secondary | ICD-10-CM | POA: Diagnosis not present

## 2022-06-28 DIAGNOSIS — Z7982 Long term (current) use of aspirin: Secondary | ICD-10-CM | POA: Insufficient documentation

## 2022-06-28 DIAGNOSIS — Z87891 Personal history of nicotine dependence: Secondary | ICD-10-CM | POA: Insufficient documentation

## 2022-06-28 DIAGNOSIS — G459 Transient cerebral ischemic attack, unspecified: Secondary | ICD-10-CM | POA: Insufficient documentation

## 2022-06-28 DIAGNOSIS — R29810 Facial weakness: Secondary | ICD-10-CM | POA: Diagnosis present

## 2022-06-28 DIAGNOSIS — Z79899 Other long term (current) drug therapy: Secondary | ICD-10-CM | POA: Diagnosis not present

## 2022-06-28 LAB — CBC WITH DIFFERENTIAL/PLATELET
Abs Immature Granulocytes: 0.02 10*3/uL (ref 0.00–0.07)
Basophils Absolute: 0 10*3/uL (ref 0.0–0.1)
Basophils Relative: 1 %
Eosinophils Absolute: 0 10*3/uL (ref 0.0–0.5)
Eosinophils Relative: 0 %
HCT: 40.8 % (ref 36.0–46.0)
Hemoglobin: 13.5 g/dL (ref 12.0–15.0)
Immature Granulocytes: 0 %
Lymphocytes Relative: 18 %
Lymphs Abs: 1.3 10*3/uL (ref 0.7–4.0)
MCH: 30.7 pg (ref 26.0–34.0)
MCHC: 33.1 g/dL (ref 30.0–36.0)
MCV: 92.7 fL (ref 80.0–100.0)
Monocytes Absolute: 0.4 10*3/uL (ref 0.1–1.0)
Monocytes Relative: 6 %
Neutro Abs: 5.4 10*3/uL (ref 1.7–7.7)
Neutrophils Relative %: 75 %
Platelets: 345 10*3/uL (ref 150–400)
RBC: 4.4 MIL/uL (ref 3.87–5.11)
RDW: 13.1 % (ref 11.5–15.5)
WBC: 7.1 10*3/uL (ref 4.0–10.5)
nRBC: 0 % (ref 0.0–0.2)

## 2022-06-28 LAB — BASIC METABOLIC PANEL
Anion gap: 12 (ref 5–15)
BUN: 15 mg/dL (ref 8–23)
CO2: 24 mmol/L (ref 22–32)
Calcium: 9 mg/dL (ref 8.9–10.3)
Chloride: 98 mmol/L (ref 98–111)
Creatinine, Ser: 0.84 mg/dL (ref 0.44–1.00)
GFR, Estimated: >60 mL/min (ref >60)
Glucose, Bld: 97 mg/dL (ref 70–99)
Potassium: 4.2 mmol/L (ref 3.5–5.1)
Sodium: 134 mmol/L — ABNORMAL LOW (ref 135–145)

## 2022-06-28 MED ORDER — LORAZEPAM 2 MG/ML IJ SOLN
1.0000 mg | INTRAMUSCULAR | Status: DC | PRN
  Administered 2022-06-28: 1 mg via INTRAVENOUS
  Filled 2022-06-28: qty 1

## 2022-06-28 NOTE — ED Notes (Signed)
Patient transported to MRI 

## 2022-06-28 NOTE — ED Provider Notes (Addendum)
[facility] EMERGENCY DEPARTMENT Provider Note   CSN: [id number] Arrival date & time: 06/28/22  0827     History  No chief complaint on file.   Candace Stewart is a 81 y.o. female.  Patient brought in by EMS for onset of neurological deficit at 730 this morning.  Patient had right facial droop and may be some weakness in the right arm but she felt as if it was heavy and kind of numb.  All symptoms lasted for about 10 minutes and everything resolved.  Patient states she still feels a little strange in the head.  Patient was just admitted on November 15 discharge November 16 for TIA work-up.  Patient had CT head patient had CT angio head and neck and had MRI without any acute findings.  Patient was noted to have high blood pressure while in the hospital.  So she was started on cardia exam and Plavix and baby aspirin.  Patient was already on Toprol XL.  Patient has not started any of the new meds yet.  All neurological symptoms have resolved at this time.  Past medical history significant for hypertension and patient's blood pressures here today are markedly elevated about 742 systolic.  No fever.  Oxygen sats are good they are in the upper 90s.  Patient has a history of gastric esophageal reflux disease history of high cholesterol hypertension history of anxiety patient's had a total abdominal hysterectomy and had a heart catheterization in 2014 does not appear any stents were done at that time.  Patient is a former smoker.       Home Medications Prior to Admission medications   Medication Sig Start Date End Date Taking? Authorizing Provider  acetaminophen (TYLENOL) 325 MG tablet Take 650 mg by mouth every 6 (six) hours as needed.    [provider]  aspirin EC 81 MG tablet Take 1 tablet (81 mg total) by mouth daily. Swallow whole. 06/26/22   Little Ishikawa, MD  Calcium Carbonate-Vitamin D3 600-400 MG-UNIT TABS Take by mouth 2 (two) times daily.     [provider]  clopidogrel (PLAVIX) 75 MG tablet Take 1 tablet (75 mg total) by mouth daily. 06/26/22   Little Ishikawa, MD  diltiazem (CARDIZEM) 30 MG tablet Take 1 tablet (30 mg total) by mouth every 8 (eight) hours. 06/26/22   Little Ishikawa, MD  esomeprazole (NEXIUM) 40 MG capsule Take 40 mg by mouth every evening.    [provider]  LORazepam (ATIVAN) 0.5 MG tablet Take 0.5 mg by mouth every 8 (eight) hours as needed for anxiety.    [provider]  metoprolol succinate (TOPROL-XL) 25 MG 24 hr tablet Take 25 mg by mouth daily. 09/13/19   [provider]  Multiple Vitamin (MULTIVITAMIN WITH MINERALS) TABS Take 1 tablet by mouth daily.    [provider]  rosuvastatin (CRESTOR) 20 MG tablet Take 1 tablet (20 mg total) by mouth daily. 06/26/22   Little Ishikawa, MD      Allergies    Amoxicillin and Sulfonamide derivatives    Review of Systems   Review of Systems  Constitutional:  Negative for chills and fever.  HENT:  Negative for ear pain, rhinorrhea and sore throat.   Eyes:  Negative for pain and visual disturbance.  Respiratory:  Negative for cough and shortness of breath.   Cardiovascular:  Negative for chest pain, palpitations and leg swelling.  Gastrointestinal:  Negative for abdominal pain, diarrhea, nausea and  vomiting.  Genitourinary:  Negative for dysuria and hematuria.  Musculoskeletal:  Negative for arthralgias, back pain and neck pain.  Skin:  Negative for color change and rash.  Neurological:  Positive for facial asymmetry, weakness and numbness. Negative for dizziness, seizures, syncope, light-headedness and headaches.  Hematological:  Does not bruise/bleed easily.  Psychiatric/Behavioral:  Negative for confusion.   All other systems reviewed and are negative.   Physical Exam Updated Vital Signs BP (!) 182/79   Pulse 81   Temp (!) 97.5 F (36.4 C)   Resp 20   SpO2 98%  Physical Exam Vitals and  nursing note reviewed.  Constitutional:      General: She is not in acute distress.    Appearance: Normal appearance. She is well-developed.  HENT:     Head: Normocephalic and atraumatic.  Eyes:     Extraocular Movements: Extraocular movements intact.     Conjunctiva/sclera: Conjunctivae normal.     Pupils: Pupils are equal, round, and reactive to light.  Cardiovascular:     Rate and Rhythm: Normal rate and regular rhythm.     Heart sounds: No murmur heard. Pulmonary:     Effort: Pulmonary effort is normal. No respiratory distress.     Breath sounds: Normal breath sounds.  Abdominal:     Palpations: Abdomen is soft.     Tenderness: There is no abdominal tenderness.  Musculoskeletal:        General: No swelling.     Cervical back: Normal range of motion and neck supple. No rigidity.  Skin:    General: Skin is warm and dry.     Capillary Refill: Capillary refill takes less than 2 seconds.  Neurological:     General: No focal deficit present.     Mental Status: She is alert and oriented to person, place, and time.     Cranial Nerves: No cranial nerve deficit.     Sensory: No sensory deficit.     Motor: No weakness.  Psychiatric:        Mood and Affect: Mood normal.     ED Results / Procedures / Treatments   Labs (all labs ordered are listed, but only abnormal results are displayed) Labs Reviewed  CBC WITH DIFFERENTIAL/PLATELET  BASIC METABOLIC PANEL    EKG EKG Interpretation  Date/Time:  Saturday June 28 2022 08:40:52 EST Ventricular Rate:  119 PR Interval:  151 QRS Duration: 86 QT Interval:  312 QTC Calculation: 439 R Axis:   56 Text Interpretation: Sinus tachycardia No significant change since last tracing Confirmed by Fredia Sorrow [phone] on 06/28/2022 10:19:50 AM  Radiology No results found.  Procedures Procedures    Medications Ordered in ED Medications  LORazepam (ATIVAN) injection 1 mg (has no administration in time range)    ED Course/  Medical Decision Making/ A&P                           Medical Decision Making Amount and/or Complexity of Data Reviewed Labs: ordered. Radiology: ordered.  Risk Prescription drug management.   Clinically sounds as if patient had a recurrent TIA.  Patient had taken a baby aspirin has not taken any of her Plavix and is also not started her cardia exam for high blood pressure.  Blood pressure is markedly elevated here.  All symptoms have resolved.  Discussed with Dr. Rory Percy the neuro hospitalist he recommended getting MRI and if negative she can go home and start her meds like  she is supposed to and continue her follow-up.  If it shows evidence of stroke then to contact them.  MRI shows no evidence of an acute infarct.  Patient without any new symptoms remains asymptomatic.  Stable for discharge home and starting her medicines.  CRITICAL CARE Performed by: Fredia Sorrow Total critical care time: 35 minutes Critical care time was exclusive of separately billable procedures and treating other patients. Critical care was necessary to treat or prevent imminent or life-threatening deterioration. Critical care was time spent personally by me on the following activities: development of treatment plan with patient and/or surrogate as well as nursing, discussions with consultants, evaluation of patient's response to treatment, examination of patient, obtaining history from patient or surrogate, ordering and performing treatments and interventions, ordering and review of laboratory studies, ordering and review of radiographic studies, pulse oximetry and re-evaluation of patient's condition.   Final Clinical Impression(s) / ED Diagnoses Final diagnoses:  TIA (transient ischemic attack)    Rx / DC Orders ED Discharge Orders     None         Fredia Sorrow, MD 06/28/22 1058    Fredia Sorrow, MD 06/28/22 1303

## 2022-06-28 NOTE — Discharge Instructions (Signed)
Definitely start your Plavix and your diltiazem.  And restart your normal medicines as we discussed.  Return for any new or worse symptoms.  Keep your appointment with your primary care doctor as scheduled for Monday.

## 2022-06-28 NOTE — ED Triage Notes (Signed)
Pt BIB by [facility] from home. Pt lives alone. Pt woke up this morning feeling normal, then started making breakfast and experienced numbness in the right forearm and below, a "weird" feeling in her head, and her daughter stated that she had a facial droop.   Pt had similar episode happen a few days ago, and dx with TIA. Pt is supposed to start taking Plavix but has not started yet. Pt supposed to start today.   No Neuro symptoms at this time, however pt states she still has a weird feeling in her head. She has had this feeling all week.   LVS 200/100  HR 100  NSR  Cbg 106  20g LAC

## 2022-07-28 NOTE — Progress Notes (Unsigned)
[facility] [address] [place]. [place] [address] [phone]       Marble QUETZALY EBNER Date of Birth:  [birth year] Medical Record Number:  [id number]   Reason for Referral:  hospital stroke follow up    SUBJECTIVE:   CHIEF COMPLAINT:  No chief complaint on file.   HPI:   Ms. Candace Stewart is a 81 y.o. female with history of depression, GERD, HLD and  osteoporosis who presented on 06/25/2022 with gait imbalance for a week, vision changes and slurred speech for 20 minutes. BP significant elevated on presentation.  Evaluated by Dr. Erlinda Hong.  CT no acute abnormality. MRI no acute infarct. CT head and neck showed right P2 moderate stenosis. 2D echo ***. EEG normal. LDL 127, A1c 5.5. Creatinine 0.89.  Etiology of symptoms not quite clear, concerning for hypertensive encephalopathy in setting of significantly elevated blood pressure although TIA also possibility.  Blood pressure improved during admission.  Recommend treatment with DAPT for 3 weeks and aspirin alone as well as initiated Crestor 20 mg daily.  Discharged home without therapy needs on 11/16.  Returned on 11/18 with right facial droop and RUE weakness with heaviness and numbness sensation.  All symptoms lasted about 10 minutes and resolved.  Per ED note, not yet started DAPT or statin.  Blood pressure noted to be markedly elevated with SBP 205.  MRI negative for acute stroke and was discharged back home        PERTINENT IMAGING  Per hospitalization 06/25/2022 Code Stroke CT head No acute abnormality.  ASPECTS 10.    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 MRI  No acute abnormality, mild chronic microvascular ischemic changes 2D Echo pending LDL 127 HgbA1c 5.5    ROS:   14 system review of systems performed and negative with exception of ***  PMH:  Past Medical History:  Diagnosis Date   Allergic rhinitis    Anxiety    Arthritis    COVID-19     Depression    Fibrocystic breast disease    GERD (gastroesophageal reflux disease)    w/ LPR   Hemorrhoids    Hiatal hernia    EGD   Hypercholesteremia    Hypertension    Mild depression    Osteopenia    Osteoporosis    Prolapsed uterus 1973   with TAH   Tubular adenoma of colon 05/2009    PSH:  Past Surgical History:  Procedure Laterality Date   ABDOMINAL HYSTERECTOMY     CARPAL TUNNEL RELEASE     LEFT HEART CATHETERIZATION WITH CORONARY ANGIOGRAM N/A 01/17/2013   Procedure: LEFT HEART CATHETERIZATION WITH CORONARY ANGIOGRAM;  Surgeon: Laverda Page, MD;  Location: [facility] CATH LAB;  Service: Cardiovascular;  Laterality: N/A;   pilonidal cyst surgery     age 16   right arm surgery     TONSILLECTOMY      Social History:  Social History   Socioeconomic History   Marital status: Married    Spouse name: Not on file   Number of children: Not on file   Years of education: Not on file   Highest education level: Not on file  Occupational History   Not on file  Tobacco Use   Smoking status: Former    Types: Cigarettes   Smokeless tobacco: Never  Vaping Use   Vaping Use: Never used  Substance and Sexual Activity   Alcohol use: No  Drug use: No   Sexual activity: Not on file  Other Topics Concern   Not on file  Social History Narrative   Not on file   Social Determinants of Health   Financial Resource Strain: Not on file  Food Insecurity: Not on file  Transportation Needs: Not on file  Physical Activity: Not on file  Stress: Not on file  Social Connections: Not on file  Intimate Partner Violence: Not on file    Family History:  Family History  Problem Relation Age of Onset   Colon polyps Sister        deceased   Colon cancer Cousin 36   Colitis Other        Mother's side of family   Diabetes Father    Diabetes Son    Diabetes Sister        deceased   Diabetes Brother    Kidney disease Sister    Pulmonary Hypertension Sister        deceased    Stroke Sister        deceased   Osteoporosis Mother    Heart failure Mother     Medications:   Current Outpatient Medications on File Prior to Visit  Medication Sig Dispense Refill   acetaminophen (TYLENOL) 325 MG tablet Take 650 mg by mouth every 6 (six) hours as needed.     aspirin EC 81 MG tablet Take 1 tablet (81 mg total) by mouth daily. Swallow whole. 30 tablet 12   Calcium Carbonate-Vitamin D3 600-400 MG-UNIT TABS Take by mouth 2 (two) times daily.     clopidogrel (PLAVIX) 75 MG tablet Take 1 tablet (75 mg total) by mouth daily. 20 tablet 0   diltiazem (CARDIZEM) 30 MG tablet Take 1 tablet (30 mg total) by mouth every 8 (eight) hours. 90 tablet 1   esomeprazole (NEXIUM) 40 MG capsule Take 40 mg by mouth every evening.     LORazepam (ATIVAN) 0.5 MG tablet Take 0.5 mg by mouth every 8 (eight) hours as needed for anxiety.     metoprolol succinate (TOPROL-XL) 25 MG 24 hr tablet Take 25 mg by mouth daily.     Multiple Vitamin (MULTIVITAMIN WITH MINERALS) TABS Take 1 tablet by mouth daily.     rosuvastatin (CRESTOR) 20 MG tablet Take 1 tablet (20 mg total) by mouth daily. 30 tablet 1   No current facility-administered medications on file prior to visit.    Allergies:   Allergies  Allergen Reactions   Amoxicillin Diarrhea    GI upset intolerance   Sulfonamide Derivatives Other (See Comments)    Sulfa Antibiotics Substance With Sulfonamide Structure And Antibacterial Mechanism Of Action (Substance)        OBJECTIVE:  Physical Exam  There were no vitals filed for this visit. There is no height or weight on file to calculate BMI. No results found.      No data to display           General: well developed, well nourished, seated, in no evident distress Head: head normocephalic and atraumatic.   Neck: supple with no carotid or supraclavicular bruits Cardiovascular: regular rate and rhythm, no murmurs Musculoskeletal: no deformity Skin:  no rash/petichiae Vascular:   Normal pulses all extremities   Neurologic Exam Mental Status: Awake and fully alert. Oriented to place and time. Recent and remote memory intact. Attention span, concentration and fund of knowledge appropriate. Mood and affect appropriate.  Cranial Nerves: Fundoscopic exam reveals sharp disc margins. Pupils equal,  briskly reactive to light. Extraocular movements full without nystagmus. Visual fields full to confrontation. Hearing intact. Facial sensation intact. Face, tongue, palate moves normally and symmetrically.  Motor: Normal bulk and tone. Normal strength in all tested extremity muscles Sensory.: intact to touch , pinprick , position and vibratory sensation.  Coordination: Rapid alternating movements normal in all extremities. Finger-to-nose and heel-to-shin performed accurately bilaterally. Gait and Station: Arises from chair without difficulty. Stance is normal. Gait demonstrates normal stride length and balance with ***. Tandem walk and heel toe ***.  Reflexes: 1+ and symmetric. Toes downgoing.     NIHSS  *** Modified Rankin  ***      ASSESSMENT: Candace Stewart is a 81 y.o. year old female with possible TIA versus hypertensive encephalopathy on 06/25/2022 after presenting with gait imbalance for a week and vision changes and slurred speech for 20 minutes and possible recurrent TIA on 06/28/2022 after presenting with 10-minute episode of right facial weakness and LUE weakness. Vascular risk factors include HTN, HLD, advanced age and former tobacco use.      PLAN:  Strokelike episodes:  Continue aspirin '81mg'$  daily and rosuvastatin (Crestor) for secondary stroke prevention.   EEG 06/2022 normal  Discussed secondary stroke prevention measures and importance of close PCP follow up for aggressive stroke risk factor management including BP goal<130/90, HLD with LDL goal<70 and DM with A1c.<7 .  Stroke labs 06/2022: LDL 127, A1c 5.5 I have gone over the pathophysiology of stroke,  warning signs and symptoms, risk factors and their management in some detail with instructions to go to the closest emergency room for symptoms of concern.     Follow up in *** or call earlier if needed   CC:  [facility] provider: Dr. Leonie Man PCP: Jani Gravel, MD    I spent *** minutes of face-to-face and non-face-to-face time with patient.  This included previsit chart review including review of recent hospitalization, lab review, study review, order entry, electronic health record documentation, patient education regarding recent stroke including etiology, secondary stroke prevention measures and importance of managing stroke risk factors, residual deficits and typical recovery time and answered all other questions to patient satisfaction   Frann Rider, AGNP-BC  [facility] [address] [place], [place] [address]  Phone [phone] Fax [phone] Note: This document was prepared with digital dictation and possible smart phrase technology. Any transcriptional errors that result from this process are unintentional.

## 2022-07-29 ENCOUNTER — Ambulatory Visit (INDEPENDENT_AMBULATORY_CARE_PROVIDER_SITE_OTHER): Payer: Medicare Other | Admitting: Adult Health

## 2022-07-29 ENCOUNTER — Encounter: Payer: Self-pay | Admitting: Adult Health

## 2022-07-29 VITALS — BP 132/88 | HR 59 | Ht <= 58 in | Wt 145.2 lb

## 2022-07-29 DIAGNOSIS — Z09 Encounter for follow-up examination after completed treatment for conditions other than malignant neoplasm: Secondary | ICD-10-CM

## 2022-07-29 DIAGNOSIS — R2990 Unspecified symptoms and signs involving the nervous system: Secondary | ICD-10-CM

## 2022-07-29 NOTE — Patient Instructions (Addendum)
It was a pleasure to meet you today!  Your Plan:  Continue aspirin and Crestor for TIA prevention Discontinue plavix as 3 weeks aspirin and plavix combination completed  Continue to follow with your primary doctor for aggressive stroke risk factor management including BP goal<130/90, and HLD with LDL goal<70    Can follow up with [place] as needed at this time   Happy Holidays!      Thank you for coming to see [place] at [facility]. I hope we have been able to provide you high quality care today.  You may receive a patient satisfaction survey over the next few weeks. We would appreciate your feedback and comments so that we may continue to improve ourselves and the health of our patients.    Transient Ischemic Attack A transient ischemic attack (TIA) causes the same symptoms as a stroke, but the symptoms go away quickly. A TIA happens when blood flow to the brain is blocked. Having a TIA means you may be at risk for a stroke. A TIA is a medical emergency. What are the causes? A TIA is caused by a blocked artery in the head or neck. This means the brain does not get the blood supply it needs. A blockage can be caused by: Fatty buildup in an artery in the head or neck. A blood clot. A tear in an artery. Irritation and swelling (inflammation) of an artery. Sometimes the cause is not known. What increases the risk? Certain things may make you more likely to have a TIA. Some of these are things that you can change, such as: Using products that have nicotine or tobacco. Not being active. Drinking too much alcohol. Using recreational drugs. Health conditions that may increase your risk include: High blood pressure. High cholesterol. Diabetes. Heart disease. A heartbeat that is not regular (atrial fibrillation). Sickle cell disease. Problems with blood clotting. Other risk factors include: Being over the age of 37. Being female. Being very overweight. Sleep problems  (sleep apnea). Having a family history of stroke. Having had blood clots, stroke, TIA, or heart attack in the past. What are the signs or symptoms? The symptoms of a TIA are like those of a stroke. They can include: Weakness or loss of feeling in your face, arm, or leg. This often happens on one side of your body. Trouble walking. Trouble moving your arms or legs. Trouble talking or understanding what people are saying. Problems with how you see. Feeling dizzy. Feeling confused. Loss of balance or coordination. Feeling like you may vomit (nausea) or vomiting. Having a very bad headache. If you can, note what time you started to have symptoms. Tell your doctor. How is this treated? The goal of treatment is to lower the risk for a stroke. This may include: Changes to diet and lifestyle, such as getting regular exercise and stopping smoking. Taking medicines to: Thin the blood. Lower blood pressure. Lower cholesterol. Treating other health conditions, such as diabetes. If testing shows that an artery in your brain is narrow, your doctor may recommend a procedure to: Take the blockage out of your artery. Open or widen an artery in your neck (carotid angioplasty and stenting). Follow these instructions at home: Medicines Take over-the-counter and prescription medicines only as told by your doctor. If you were told to take aspirin or another medicine to thin your blood, use it exactly as told by your doctor. Taking too much of the medicine can cause bleeding. Taking too little of the medicine  may not work to treat the problem. Eating and drinking  Eat 5 or more servings of fruits and vegetables each day. Follow instructions from your doctor about your diet. You may need to follow a certain diet to help lower your risk of a stroke. You may need to: Eat a diet that is low in fat and salt. Eat foods with a lot of fiber. Limit carbohydrates and sugar. If you drink alcohol: Limit how  much you have to: 0-1 drink a day for women who are not pregnant. 0-2 drinks a day for men. Know how much alcohol is in a drink. In the [place] one drink equals one 12 oz bottle of beer (355 mL), one 5 oz glass of wine (148 mL), or one 1 oz glass of hard liquor (44 mL). General instructions Keep a healthy weight. Try to get at least 30 minutes of exercise on most days. Get treatment if you have sleep problems. Do not smoke or use any products that contain nicotine or tobacco. If you need help quitting, ask your doctor. Do not use drugs. Keep all follow-up visits. Your doctor will want to know if you have any more symptoms and to check blood labs if any medicines were prescribed. Where to find more information American Stroke Association: stroke.org Get help right away if: You have chest pain. You have a heartbeat that is not regular. You have any signs of a stroke. "BE FAST" is an easy way to remember the main warning signs: B - Balance. Dizziness, sudden trouble walking, or loss of balance. E - Eyes. Trouble seeing or a change in how you see. F - Face. Sudden weakness or loss of feeling of the face. The face or eyelid may droop on one side. A - Arms. Weakness or loss of feeling in an arm. This happens all of a sudden and most often on one side of the body. S - Speech. Sudden trouble speaking, slurred speech, or trouble understanding what people say. T - Time. Time to call emergency services. Write down what time symptoms started. You have other signs of a stroke, such as: A sudden, very bad headache with no known cause. Feeling like you may vomit. Vomiting. A seizure. These symptoms may be an emergency. Get help right away. Call 911. Do not wait to see if the symptoms will go away. Do not drive yourself to the hospital. This information is not intended to replace advice given to you by your health care provider. Make sure you discuss any questions you have with your health care  provider. Document Revised: 01/10/2022 Document Reviewed: 01/10/2022 Elsevier Patient Education  [place].

## 2024-03-08 ENCOUNTER — Other Ambulatory Visit: Payer: Self-pay | Admitting: *Deleted

## 2024-03-08 ENCOUNTER — Other Ambulatory Visit (HOSPITAL_COMMUNITY): Payer: Self-pay

## 2024-03-08 ENCOUNTER — Encounter: Payer: Self-pay | Admitting: Cardiology

## 2024-03-08 ENCOUNTER — Ambulatory Visit: Attending: Cardiology | Admitting: Cardiology

## 2024-03-08 VITALS — BP 169/84 | HR 67 | Resp 18 | Ht <= 58 in | Wt 149.0 lb

## 2024-03-08 DIAGNOSIS — R002 Palpitations: Secondary | ICD-10-CM | POA: Diagnosis not present

## 2024-03-08 DIAGNOSIS — I358 Other nonrheumatic aortic valve disorders: Secondary | ICD-10-CM

## 2024-03-08 DIAGNOSIS — I10 Essential (primary) hypertension: Secondary | ICD-10-CM

## 2024-03-08 MED ORDER — OLMESARTAN MEDOXOMIL-HCTZ 20-12.5 MG PO TABS
1.0000 | ORAL_TABLET | ORAL | 2 refills | Status: AC
  Filled 2024-03-08: qty 30, 30d supply, fill #0
  Filled 2024-04-13: qty 30, 30d supply, fill #1
  Filled 2024-05-24: qty 30, 30d supply, fill #2

## 2024-03-08 NOTE — Progress Notes (Signed)
 Cardiology Office Note:  .   Date:  03/08/2024  ID:  Candace Stewart, DOB November 16, 1940, MRN [id number] PCP: Royden Ronal Czar, FNP  [facility] Providers Cardiologist:  Gordy Bergamo, MD   History of Present Illness: .   Candace Stewart is a 83 y.o. 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  Discussed the use of AI scribe software for clinical note transcription with the patient, who gave verbal consent to proceed.  History of Present Illness Candace Stewart is an 83 year old female with a history of rheumatic fever and TIAs who presents with chest pain and palpitations. She is accompanied by her daughter, Rea. She was referred by Mary Prevost for evaluation of chest pain.  Chest pain, distinct from acid reflux, was intermittent over a 2 days with no recurrence, that occurred for months ago. It did not worsen with physical activity. No current chest pain is present. Palpitations occur occasionally, described as a 'little skip' in heart rhythm, without significant concern. She takes metoprolol  XL 25 mg once daily and diltiazem  30 mg three times a day for blood pressure and palpitations.  She has a history of TIAs with facial numbness, with negative MRI evaluations. Cholesterol levels are controlled with medication. Shortness of breath occurs when climbing stairs, attributed to a 20-pound weight gain since her last visit. Physical activity is limited, though she attempts to walk when possible, recently walking in the park for a mile or two before the weather became too hot.  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 No chest pain has occurred since March, and she denies current symptoms of chest pain.  Labs   Lab Results  Component Value Date   CHOL 209 (H) 06/26/2022   HDL 63 06/26/2022   LDLCALC 127 (H) 06/26/2022   TRIG 97 06/26/2022   CHOLHDL 3.3 06/26/2022   No results found for: LIPOA  Lab Results  Component Value Date   NA 134 (L) 06/28/2022   K 4.2 06/28/2022   CO2 24 06/28/2022   GLUCOSE 97 06/28/2022   BUN 15 06/28/2022   CREATININE 0.84 06/28/2022   CALCIUM  9.0 06/28/2022   GFRNONAA >60 06/28/2022      Latest Ref Rng & Units 06/28/2022   11:20 AM 06/26/2022    5:00 AM 06/25/2022    2:39 PM  BMP  Glucose 70 - 99 mg/dL 97  94    BUN 8 - 23 mg/dL 15  15    Creatinine 9.55 - 1.00 mg/dL 9.15  9.10  9.38   Sodium 135 - 145 mmol/L 134  133    Potassium 3.5 - 5.1 mmol/L 4.2  4.1    Chloride 98 - 111 mmol/L 98  99    CO2 22 - 32 mmol/L 24  26    Calcium  8.9 - 10.3 mg/dL 9.0  9.0        Latest Ref Rng & Units 06/28/2022   11:20 AM 06/26/2022    5:00 AM 06/25/2022    2:39 PM  CBC  WBC 4.0 - 10.5 K/uL 7.1  5.9  8.1   Hemoglobin 12.0 - 15.0 g/dL 86.4  85.7  12.8   Hematocrit 36.0 - 46.0 % 40.8  40.5  39.5   Platelets 150 - 400 K/uL 345  346  346    Lab Results  Component Value Date   HGBA1C 5.5 06/26/2022    Lab Results  Component Value Date   TSH 1.247 06/25/2022    External Labs:  PCP faxed labs 10/29/2023:  Serum glucose 82 mg, BUN 11, creatinine 0.86, EGFR 67 mL, potassium 4.7, LFTs normal.  Total cholesterol 127, triglycerides 85, HDL 59, LDL 52.  PCP EKG 11/05/2023: Normal sinus rhythm with rate of 75 bpm.  ROS  Review of Systems  Cardiovascular:  Positive for dyspnea on exertion. Negative for chest pain and leg swelling.   Physical Exam:   VS:  BP (!) 169/84 (BP Location: Left Arm, Patient Position: Sitting, Cuff Size: Normal)   Pulse 67   Resp 18   Ht 4' 9 (1.448 m)   Wt 149 lb (67.6 kg)   SpO2 93%   BMI 32.24 kg/m    Wt  Readings from Last 3 Encounters:  03/08/24 149 lb (67.6 kg)  07/29/22 145 lb 3.2 oz (65.9 kg)  06/25/22 152 lb 8.9 oz (69.2 kg)    Physical Exam Neck:     Vascular: No JVD.  Cardiovascular:     Rate and Rhythm: Normal rate and regular rhythm.     Pulses: Intact distal pulses.          Carotid pulses are  on the right side with bruit and  on the left side with bruit.    Heart sounds: S1 normal and S2 normal. Murmur heard.     Crescendo midsystolic murmur is present with a grade of 3/6 at the upper left sternal border and apex radiating to the neck.     No gallop.  Pulmonary:     Effort: Pulmonary effort is normal.     Breath sounds: Normal breath sounds.  Abdominal:     General: Bowel sounds are normal.     Palpations: Abdomen is soft.  Musculoskeletal:     Right lower leg: No edema.     Left lower leg: No edema.    Studies Reviewed: .    Echocardiogram 06/26/2022:  1. Left ventricular ejection fraction, by estimation, is 65 to 70%. The left ventricle has normal function. The left ventricle has no regional wall motion abnormalities. Left ventricular diastolic parameters are indeterminate. 2. Right ventricular systolic function is normal. The right ventricular size is normal. There is normal pulmonary artery systolic pressure. 3. Trivial mitral valve regurgitation. Moderate to severe mitral annular calcification. 4. The aortic valve is tricuspid. Aortic valve regurgitation is not visualized. Aortic valve sclerosis/calcification is present, without any evidence of aortic stenosis. 5. The inferior vena cava is normal in size with greater than 50% respiratory variability, suggesting right atrial pressure of 3 mmHg.  CT angio head and neck 06/25/2022: 1. No large vessel occlusion. 2. 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 3. No hemodynamically significant stenosis of the neck. 4. Aortic atherosclerosis.   EKG:     EKG Interpretation Date/Time:  Tuesday March 08 2024 08:27:02 EDT Ventricular Rate:  61 PR Interval:  152 QRS Duration:  84 QT Interval:  384 QTC Calculation: 386 R Axis:   34  Text Interpretation: EKG 03/08/2024: Normal sinus rhythm with rate of 61 bpm, normal EKG.  Compared to 06/28/2022, sinus tachycardia at the rate of 119 bpm not present. Confirmed  by Nakita Santerre, Jagadeesh ([id number]) on 03/08/2024 8:46:23 AM    Medications ordered    No orders of the defined types were placed in this encounter.    ASSESSMENT AND PLAN: .      ICD-10-CM   1. Palpitations  R00.2 EKG 12-Lead    2. Primary hypertension  I10     3. Aortic systolic murmur on examination  I35.8      Assessment & Plan Aortic valve thickening with suspected aortic stenosis and murmur Aortic valve thickening observed on previous echocardiogram with suspicion of progression to stenosis. Heart murmur is more pronounced. Shortness of breath may be related to this condition, but she is asymptomatic for chest pain and not diabetic, reducing the likelihood of significant coronary artery disease. - Order echocardiogram to assess aortic valve function  Hypertension, not well controlled Blood pressure remains elevated, with readings around 140/77 mmHg and higher. Experiences white coat syndrome, contributing to elevated readings in clinical settings. Current medications include metoprolol  and diltiazem , insufficient for optimal control. - Prescribe Benicar  HCT 20/12.5 mg once daily in the morning - Order blood test in 3-4 weeks to monitor kidney function and potassium levels  Palpitations Intermittent palpitations with occasional skipped beats. Managed with metoprolol  and diltiazem , which she tolerates well. Palpitations are not frequent or severe enough to warrant immediate changes in management. - Continue current medications: metoprolol  25 mg once daily and diltiazem  CD 30 mg three times daily - Discuss option to switch diltiazem   plain 30 mg 3 times daily to diltiazem  CD 120 mg once daily for convenience  Transient ischemic attack (TIA) Two TIAs with negative MRI findings. Currently on cholesterol-lowering medication with well-controlled lipid levels.  Hyperlipidemia, well controlled Cholesterol levels are well controlled with current medication regimen.  Goal LDL <70, presently 52.  External labs reviewed.  Office visit in 2 to 3 months and if stable on a as needed basis.  In the absence of chest pain, and appropriate risk factor modification, no indication for stress testing at this time.  Total time spent 38 minutes in reviewing external records, reviewing her hospitalization records from previous, discussions regarding multiple medications and management of complex issues.   Signed,  Gordy Bergamo, MD, [facility] 03/08/2024, 9:03 AM [facility] [address] [place], [place] [address] Phone: [phone]. Fax:  [phone]

## 2024-03-08 NOTE — Patient Instructions (Addendum)
 Medication Instructions:  Your physician has recommended you make the following change in your medication: Start Olmesartan  hydrochlorothiazide  20/12.5 mg by mouth daily    *If you need a refill on your cardiac medications before your next appointment, please call your pharmacy*  Lab Work: Have lab work done in 2-3 weeks.  (BMP).  This is not fasting.  Can be done at any [facility] location If you have labs (blood work) drawn today and your tests are completely normal, you will receive your results only by: MyChart Message (if you have MyChart) OR A paper copy in the mail If you have any lab test that is abnormal or we need to change your treatment, we will call you to review the results.  Testing/Procedures: Your physician has requested that you have an echocardiogram. Echocardiography is a painless test that uses sound waves to create images of your heart. It provides your doctor with information about the size and shape of your heart and how well your heart's chambers and valves are working. This procedure takes approximately one hour. There are no restrictions for this procedure. Please do NOT wear cologne, perfume, aftershave, or lotions (deodorant is allowed). Please arrive 15 minutes prior to your appointment time.  Please note: We ask at that you not bring children with you during ultrasound (echo/ vascular) testing. Due to room size and safety concerns, children are not allowed in the ultrasound rooms during exams. Our front office staff cannot provide observation of children in our lobby area while testing is being conducted. An adult accompanying a patient to their appointment will only be allowed in the ultrasound room at the discretion of the ultrasound technician under special circumstances. We apologize for any inconvenience.   Follow-Up: At [facility], you and your health needs are our priority.  As part of our continuing mission to provide you with exceptional heart  care, our providers are all part of one team.  This team includes your primary Cardiologist (physician) and Advanced Practice Providers or APPs (Physician Assistants and Nurse Practitioners) who all work together to provide you with the care you need, when you need it.  Your next appointment:   2-2 1/2 month(s)  Provider:   Gordy Bergamo, MD    We recommend signing up for the patient portal called MyChart.  Sign up information is provided on this After Visit Summary.  MyChart is used to connect with patients for Virtual Visits (Telemedicine).  Patients are able to view lab/test results, encounter notes, upcoming appointments, etc.  Non-urgent messages can be sent to your provider as well.   To learn more about what you can do with MyChart, go to [email or web address].   Other Instructions You may go to any of these [facility] locations:   [facility] - [address] ([facility]) - [address] [phone] [address] - [address] (1st floor, next to pharmacy)   [place] - [address]    [place]  - [address]    [place] - [address] [employer]  - [address] ([facility])  [place]   - [address], [address]

## 2024-03-25 ENCOUNTER — Ambulatory Visit: Payer: Self-pay | Admitting: Cardiology

## 2024-03-25 LAB — BASIC METABOLIC PANEL WITH GFR
BUN/Creatinine Ratio: 11 — ABNORMAL LOW (ref 12–28)
BUN: 10 mg/dL (ref 8–27)
CO2: 22 mmol/L (ref 20–29)
Calcium: 9.4 mg/dL (ref 8.7–10.3)
Chloride: 96 mmol/L (ref 96–106)
Creatinine, Ser: 0.88 mg/dL (ref 0.57–1.00)
Glucose: 97 mg/dL (ref 70–99)
Potassium: 4.9 mmol/L (ref 3.5–5.2)
Sodium: 132 mmol/L — ABNORMAL LOW (ref 134–144)
eGFR: 65 mL/min/1.73 (ref >59)

## 2024-03-25 NOTE — Progress Notes (Signed)
 Stable labs, discuss on your visit soon

## 2024-04-13 ENCOUNTER — Ambulatory Visit (HOSPITAL_COMMUNITY)
Admission: RE | Admit: 2024-04-13 | Discharge: 2024-04-13 | Disposition: A | Discharge status: Home / Self Care | Source: Ambulatory Visit | Attending: Internal Medicine | Admitting: Internal Medicine

## 2024-04-13 ENCOUNTER — Other Ambulatory Visit (HOSPITAL_COMMUNITY): Payer: Self-pay

## 2024-04-13 DIAGNOSIS — I358 Other nonrheumatic aortic valve disorders: Secondary | ICD-10-CM | POA: Insufficient documentation

## 2024-04-13 DIAGNOSIS — I10 Essential (primary) hypertension: Secondary | ICD-10-CM | POA: Diagnosis not present

## 2024-04-13 DIAGNOSIS — R002 Palpitations: Secondary | ICD-10-CM | POA: Insufficient documentation

## 2024-04-13 LAB — ECHOCARDIOGRAM COMPLETE
AR max vel: 1.37 cm2
AV Area VTI: 1.27 cm2
AV Area mean vel: 1.22 cm2
AV Mean grad: 22.1 mmHg
AV Peak grad: 38.3 mmHg
Ao pk vel: 3.09 m/s
S' Lateral: 2.02 cm

## 2024-04-13 NOTE — Progress Notes (Signed)
 Echocardiogram reveals normal LV systolic function.  Patient has left ventricular outflow tract gradient and is the reason for murmur.  Nothing needs to be done differently.  Regular exercise is still very crucial and important.  Will see you back in 2 to 3 months, keep your appointment.

## 2024-04-13 NOTE — Progress Notes (Signed)
 There is no aortic stenosis.  The murmur is related to thickening of the interventricular septum and causing mild obstruction to left ventricular outflow tract.

## 2024-04-16 NOTE — Progress Notes (Signed)
 Echocardiogram reveals normal LV systolic function. Patient has left ventricular outflow tract gradient and is the reason for murmur. Nothing needs to be done differently. Regular exercise is still very crucial and important. Will see you back in 2 to 3 months, keep your appointment.   With age, the septum near the aortic valve (base of the heart) gets thickened and when heart squeezes, the outflow from the LV to the aorta is obstructed from this thickening and unless severe, not of clinical consequence. Reassure

## 2024-04-20 NOTE — Telephone Encounter (Signed)
 Pt returning call

## 2024-04-28 LAB — LAB REPORT - SCANNED: EGFR: 73

## 2024-05-11 NOTE — Progress Notes (Signed)
 Cardiology Office Note:  .   Date:  05/12/2024  ID:  Candace Stewart, DOB 12-11-1940, MRN [id number] PCP: Royden Ronal Czar, FNP  [facility] Providers Cardiologist:  Gordy Bergamo, MD   History of Present Illness: .   Candace Stewart is a 83 y.o. 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  Underwent echocardiogram revealing moderate aortic stenosis on 04/13/2024. 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 Remains asymptomatic.  Cardiac Studies relevent.    ECHOCARDIOGRAM COMPLETE 04/13/2024  1. Left ventricular ejection fraction, by estimation, is 70 to 75%. Left ventricular ejection fraction by 3D volume is 68 %. The left ventricle has hyperdynamic function. No regional wall motion abnormalities. There is moderate asymmetric left ventricular hypertrophy of the basal-septal segment (13 mm) with an LVOT gradient of 34 mmHg with Valsalva, no significant resting LVOT gradient. Left ventricular diastolic parameters are indeterminate. The average left ventricular global longitudinal strain is -23.8 %. The global longitudinal strain is normal. 2. Normal RV. Tricuspid valve regurgitation is mild to moderate. The estimated right ventricular systolic pressure is 36.9 mmHg.  3. The mitral valve is degenerative. Mild mitral valve regurgitation. No evidence of mitral stenosis. Moderate mitral annular calcification. 4. The aortic valve is abnormal. There is severe calcifcation of the aortic valve. Aortic valve regurgitation is not visualized. Moderate aortic valve  stenosis. Aortic valve area, by VTI measures 1.27 cm. Aortic valve mean gradient measures 22.1 mmHg. Aortic valve Vmax measures 3.09 m/s.    Discussed the use of AI scribe software for clinical note transcription with the patient, who gave verbal consent to proceed.  History of Present Illness Candace Stewart is an 83 year old female with hypertension and aortic stenosis who presents for cardiovascular follow-up.  She experiences stable shortness of breath when climbing stairs, without chest pain or significant dyspnea during other activities. Blood pressure readings at home average 128/83 mmHg. She previously discontinued omisartan HCT due to adverse effects, including kidney pain, spasms, and nausea. Current medications include diltiazem  CD 30 mg every eight hours and metoprolol  for heart palpitations.  Her sodium level is 134 mmol/L, within normal limits. An echocardiogram two years ago showed mild thickening, now progressed to moderate stenosis. She has lost five pounds recently and is managing her weight. Her cholesterol panel shows total cholesterol of 138 mg/dL, triglycerides of 844 mg/dL, HDL of 56 mg/dL, and LDL of 56 mg/dL, all within acceptable ranges.  Labs   Lab Results  Component Value Date   NA 132 (L) 03/24/2024   K 4.9 03/24/2024   CO2 22 03/24/2024   GLUCOSE 97 03/24/2024   BUN 10 03/24/2024   CREATININE 0.88 03/24/2024   CALCIUM  9.4 03/24/2024   EGFR 73.0 04/28/2024   GFRNONAA >60 06/28/2022       Latest Ref Rng & Units 03/24/2024    8:21 AM 06/28/2022   11:20 AM 06/26/2022    5:00 AM  BMP  Glucose 70 - 99 mg/dL 97  97  94   BUN 8 - 27 mg/dL 10  15  15    Creatinine 0.57 - 1.00 mg/dL 9.11  9.15  9.10  BUN/Creat Ratio 12 - 28 11     Sodium 134 - 144 mmol/L 132  134  133   Potassium 3.5 - 5.2 mmol/L 4.9  4.2  4.1   Chloride 96 - 106 mmol/L 96  98  99   CO2 20 - 29 mmol/L 22  24  26    Calcium  8.7 - 10.3 mg/dL 9.4  9.0  9.0     PCP faxed labs 10/29/2023:    Serum glucose 82 mg, BUN 11, creatinine 0.86, EGFR 67 mL, potassium 4.7, LFTs normal.   Total cholesterol 127, triglycerides 85, HDL 59, LDL 52.   PCP EKG 11/05/2023: Normal sinus rhythm with rate of 75 bpm.  ROS  Review of Systems  Cardiovascular:  Positive for dyspnea on exertion (chronic and stable\). Negative for chest pain and leg swelling.   Physical Exam:   VS:  BP (!) 144/82   Pulse 75   Ht 4' 9 (1.448 m)   Wt 147 lb (66.7 kg)   SpO2 95%   BMI 31.81 kg/m    Wt Readings from Last 3 Encounters:  05/12/24 147 lb (66.7 kg)  03/08/24 149 lb (67.6 kg)  07/29/22 145 lb 3.2 oz (65.9 kg)    BP Readings from Last 3 Encounters:  05/12/24 (!) 144/82  03/08/24 (!) 169/84  07/29/22 132/88   Physical Exam Neck:     Vascular: No carotid bruit or JVD.  Cardiovascular:     Rate and Rhythm: Normal rate and regular rhythm.     Heart sounds: S1 normal and S2 normal. Murmur heard.     Crescendo midsystolic murmur is present with a grade of 3/6 at the upper left sternal border and apex radiating to the neck.     No gallop.  Pulmonary:     Effort: Pulmonary effort is normal.     Breath sounds: Normal breath sounds.  Abdominal:     General: Bowel sounds are normal.     Palpations: Abdomen is soft.  Musculoskeletal:     Right lower leg: No edema.     Left lower leg: No edema.    EKG:         ASSESSMENT AND PLAN: .      ICD-10-CM   1. Moderate aortic stenosis  I35.0 Basic Metabolic Panel (BMET)    ECHOCARDIOGRAM COMPLETE    2. Primary hypertension  I10 diltiazem  (CARDIZEM  CD) 180 MG 24 hr capsule    Basic metabolic panel with GFR    Basic Metabolic Panel (BMET)    ECHOCARDIOGRAM COMPLETE    3. Hyponatremia  E87.1 Basic Metabolic Panel (BMET)    ECHOCARDIOGRAM COMPLETE    4. Palpitations  R00.2 diltiazem  (CARDIZEM  CD) 180 MG 24 hr capsule    Basic Metabolic Panel (BMET)    ECHOCARDIOGRAM COMPLETE     Assessment & Plan Essential hypertension Hypertension  management is complicated by adverse effects from omisartan HCT, including nausea and possible back spasms. Blood pressure readings at home are generally controlled, but she experiences variability. Omisartan HCT is effective in controlling blood pressure but may cause electrolyte imbalances, contributing to hyponatremia. She is willing to retry the medication despite previous adverse effects, as it effectively controls her blood pressure. - Restart omisartan HCT 20/12.5 mg once daily in the morning. - Order basic metabolic panel (BMP) in 2-3 weeks after restarting medication. - Monitor blood pressure and report any adverse effects via MyChart. - If omisartan HCT is not tolerated, consider alternative antihypertensive medication, probably discontinue HCT  component and try ARB alone or switch low dose metoprolol  succinate to Bystolic.  Hyponatremia Chronic low sodium levels, possibly medication-induced. Sodium levels have been stable, with recent labs showing normal sodium at 134 mmol/L. Hyponatremia is not related to dietary salt intake. Monitoring is necessary to ensure stability after restarting omisartan HCT. - Monitor sodium levels with upcoming BMP in 2-3 weeks after restarting omisartan HCT.  Nonrheumatic aortic valve stenosis Moderate aortic valve stenosis, likely senile aortic stenosis, not related to rheumatic heart disease. Condition is well-managed with no significant symptoms such as worsening shortness of breath. Previous echocardiogram showed mild thickening, now progressed to stenosis. Monitoring is essential to assess for any progression of symptoms. - Repeat echocardiogram in one year. - Monitor for symptoms such as increased shortness of breath or decreased exercise tolerance.  Palpitations Palpitations managed with diltiazem , currently on 30 mg every 8 hours. She finds the dosing schedule challenging, and a change to once daily dosing is planned to improve adherence. - Change  diltiazem  to once daily dosing and send prescription to mail order pharmacy for a 90-day supply with one refill.  Follow up: 1 Year or sooner if problems  Signed,  Gordy Bergamo, MD, [facility] 05/12/2024, 3:27 PM [facility] [address] [place], [place] [address] Phone: [phone]. Fax:  [phone]

## 2024-05-12 ENCOUNTER — Ambulatory Visit: Attending: Cardiology | Admitting: Cardiology

## 2024-05-12 ENCOUNTER — Other Ambulatory Visit: Payer: Self-pay

## 2024-05-12 ENCOUNTER — Encounter: Payer: Self-pay | Admitting: Cardiology

## 2024-05-12 VITALS — BP 144/82 | HR 75 | Ht <= 58 in | Wt 147.0 lb

## 2024-05-12 DIAGNOSIS — I350 Nonrheumatic aortic (valve) stenosis: Secondary | ICD-10-CM | POA: Diagnosis not present

## 2024-05-12 DIAGNOSIS — I10 Essential (primary) hypertension: Secondary | ICD-10-CM | POA: Diagnosis not present

## 2024-05-12 DIAGNOSIS — R002 Palpitations: Secondary | ICD-10-CM

## 2024-05-12 DIAGNOSIS — E871 Hypo-osmolality and hyponatremia: Secondary | ICD-10-CM

## 2024-05-12 MED ORDER — DILTIAZEM HCL ER COATED BEADS 180 MG PO CP24: 180.0000 mg | ORAL_CAPSULE | Freq: Every day | ORAL | 1 refills | Status: AC

## 2024-05-12 NOTE — Patient Instructions (Signed)
 Medication Instructions:  RESTART Olmesartan -hydrochlorothiazide  20/12.5mg  Take 1 tablet once a day  *If you need a refill on your cardiac medications before your next appointment, please call your pharmacy*  Lab Work: BMET IN 2 WEEKS  If you have labs (blood work) drawn today and your tests are completely normal, you will receive your results only by: MyChart Message (if you have MyChart) OR A paper copy in the mail If you have any lab test that is abnormal or we need to change your treatment, we will call you to review the results.  Testing/Procedures: Your physician has requested that you have an echocardiogram. Echocardiography is a painless test that uses sound waves to create images of your heart. It provides your doctor with information about the size and shape of your heart and how well your heart's chambers and valves are working. This procedure takes approximately one hour. There are no restrictions for this procedure. Please do NOT wear cologne, perfume, aftershave, or lotions (deodorant is allowed). Please arrive 15 minutes prior to your appointment time.  Please note: We ask at that you not bring children with you during ultrasound (echo/ vascular) testing. Due to room size and safety concerns, children are not allowed in the ultrasound rooms during exams. Our front office staff cannot provide observation of children in our lobby area while testing is being conducted. An adult accompanying a patient to their appointment will only be allowed in the ultrasound room at the discretion of the ultrasound technician under special circumstances. We apologize for any inconvenience.    Follow-Up: At [facility], you and your health needs are our priority.  As part of our continuing mission to provide you with exceptional heart care, our providers are all part of one team.  This team includes your primary Cardiologist (physician) and Advanced Practice Providers or APPs (Physician  Assistants and Nurse Practitioners) who all work together to provide you with the care you need, when you need it.  Your next appointment:   12 month(s)  Provider:   Gordy Bergamo, MD    We recommend signing up for the patient portal called MyChart.  Sign up information is provided on this After Visit Summary.  MyChart is used to connect with patients for Virtual Visits (Telemedicine).  Patients are able to view lab/test results, encounter notes, upcoming appointments, etc.  Non-urgent messages can be sent to your provider as well.   To learn more about what you can do with MyChart, go to [email or web address].   Other Instructions

## 2024-05-24 ENCOUNTER — Other Ambulatory Visit (HOSPITAL_COMMUNITY): Payer: Self-pay

## 2024-07-04 ENCOUNTER — Other Ambulatory Visit (HOSPITAL_COMMUNITY): Payer: Self-pay
